# Patient Record
Sex: Female | Born: 1995 | Race: Black or African American | Hispanic: No | Marital: Single | Smoking: Never smoker
Health system: Southern US, Community
[De-identification: ages and names within clinical notes are randomized; demographics above are authoritative.]

## PROBLEM LIST (undated history)

## (undated) DIAGNOSIS — K589 Irritable bowel syndrome without diarrhea: Secondary | ICD-10-CM

## (undated) DIAGNOSIS — F419 Anxiety disorder, unspecified: Secondary | ICD-10-CM

## (undated) DIAGNOSIS — N159 Renal tubulo-interstitial disease, unspecified: Secondary | ICD-10-CM

## (undated) DIAGNOSIS — T7840XA Allergy, unspecified, initial encounter: Secondary | ICD-10-CM

## (undated) DIAGNOSIS — N946 Dysmenorrhea, unspecified: Secondary | ICD-10-CM

## (undated) DIAGNOSIS — Z87442 Personal history of urinary calculi: Secondary | ICD-10-CM

## (undated) DIAGNOSIS — E559 Vitamin D deficiency, unspecified: Secondary | ICD-10-CM

## (undated) DIAGNOSIS — N390 Urinary tract infection, site not specified: Secondary | ICD-10-CM

## (undated) DIAGNOSIS — N920 Excessive and frequent menstruation with regular cycle: Secondary | ICD-10-CM

## (undated) DIAGNOSIS — K219 Gastro-esophageal reflux disease without esophagitis: Secondary | ICD-10-CM

## (undated) DIAGNOSIS — K529 Noninfective gastroenteritis and colitis, unspecified: Secondary | ICD-10-CM

## (undated) DIAGNOSIS — R7303 Prediabetes: Secondary | ICD-10-CM

## (undated) HISTORY — DX: Dysmenorrhea, unspecified: N94.6

## (undated) HISTORY — PX: WRIST SURGERY: SHX841

## (undated) HISTORY — DX: Prediabetes: R73.03

## (undated) HISTORY — DX: Gastro-esophageal reflux disease without esophagitis: K21.9

## (undated) HISTORY — DX: Irritable bowel syndrome, unspecified: K58.9

## (undated) HISTORY — DX: Vitamin D deficiency, unspecified: E55.9

## (undated) HISTORY — DX: Allergy, unspecified, initial encounter: T78.40XA

## (undated) HISTORY — DX: Anxiety disorder, unspecified: F41.9

## (undated) HISTORY — PX: WISDOM TOOTH EXTRACTION: SHX21

## (undated) HISTORY — PX: HERNIA REPAIR: SHX51

## (undated) HISTORY — DX: Excessive and frequent menstruation with regular cycle: N92.0

---
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
## 2015-09-11 ENCOUNTER — Ambulatory Visit (INDEPENDENT_AMBULATORY_CARE_PROVIDER_SITE_OTHER): Payer: BC Managed Care – PPO | Admitting: Physician Assistant

## 2015-09-11 ENCOUNTER — Telehealth: Payer: Self-pay

## 2015-09-11 ENCOUNTER — Encounter: Payer: Self-pay | Admitting: Physician Assistant

## 2015-09-11 VITALS — BP 110/68 | HR 68 | Temp 98.6°F | Resp 18 | Ht 61.0 in | Wt 152.0 lb

## 2015-09-11 DIAGNOSIS — N390 Urinary tract infection, site not specified: Secondary | ICD-10-CM

## 2015-09-11 DIAGNOSIS — R300 Dysuria: Secondary | ICD-10-CM

## 2015-09-11 DIAGNOSIS — R319 Hematuria, unspecified: Secondary | ICD-10-CM

## 2015-09-11 LAB — POCT URINALYSIS DIP (MANUAL ENTRY)
BILIRUBIN UA: NEGATIVE
GLUCOSE UA: NEGATIVE
Ketones, POC UA: NEGATIVE
Nitrite, UA: NEGATIVE
PH UA: 6
Spec Grav, UA: 1.025
UROBILINOGEN UA: 0.2

## 2015-09-11 LAB — POC MICROSCOPIC URINALYSIS (UMFC): MUCUS RE: ABSENT

## 2015-09-11 MED ORDER — CIPROFLOXACIN HCL 500 MG PO TABS: 500.0000 mg | ORAL_TABLET | Freq: Two times a day (BID) | ORAL | 0 refills | Status: DC

## 2015-09-11 NOTE — Patient Instructions (Addendum)
   IF you received an x-ray today, you will receive an invoice from  Radiology. Please contact  Radiology at [phone] with questions or concerns regarding your invoice.   IF you received labwork today, you will receive an invoice from [facility]. Please contact [facility] at [phone] with questions or concerns regarding your invoice.   Our billing staff will not be able to assist you with questions regarding bills from these companies.  You will be contacted with the lab results as soon as they are available. The fastest way to get your results is to activate your My Chart account. Instructions are located on the last page of this paperwork. If you have not heard from us regarding the results in 2 weeks, please contact this office.     Urinary Tract Infection Urinary tract infections (UTIs) can develop anywhere along your urinary tract. Your urinary tract is your body's drainage system for removing wastes and extra water. Your urinary tract includes two kidneys, two ureters, a bladder, and a urethra. Your kidneys are a pair of bean-shaped organs. Each kidney is about the size of your fist. They are located below your ribs, one on each side of your spine. CAUSES Infections are caused by microbes, which are microscopic organisms, including fungi, viruses, and bacteria. These organisms are so small that they can only be seen through a microscope. Bacteria are the microbes that most commonly cause UTIs. SYMPTOMS  Symptoms of UTIs may vary by age and gender of the patient and by the location of the infection. Symptoms in young women typically include a frequent and intense urge to urinate and a painful, burning feeling in the bladder or urethra during urination. Older women and men are more likely to be tired, shaky, and weak and have muscle aches and abdominal pain. A fever may mean the infection is in your kidneys. Other symptoms of a kidney  infection include pain in your back or sides below the ribs, nausea, and vomiting. DIAGNOSIS To diagnose a UTI, your caregiver will ask you about your symptoms. Your caregiver will also ask you to provide a urine sample. The urine sample will be tested for bacteria and white blood cells. White blood cells are made by your body to help fight infection. TREATMENT  Typically, UTIs can be treated with medication. Because most UTIs are caused by a bacterial infection, they usually can be treated with the use of antibiotics. The choice of antibiotic and length of treatment depend on your symptoms and the type of bacteria causing your infection. HOME CARE INSTRUCTIONS  If you were prescribed antibiotics, take them exactly as your caregiver instructs you. Finish the medication even if you feel better after you have only taken some of the medication.  Drink enough water and fluids to keep your urine clear or pale yellow.  Avoid caffeine, tea, and carbonated beverages. They tend to irritate your bladder.  Empty your bladder often. Avoid holding urine for long periods of time.  Empty your bladder before and after sexual intercourse.  After a bowel movement, women should cleanse from front to back. Use each tissue only once. SEEK MEDICAL CARE IF:   You have back pain.  You develop a fever.  Your symptoms do not begin to resolve within 3 days. SEEK IMMEDIATE MEDICAL CARE IF:   You have severe back pain or lower abdominal pain.  You develop chills.  You have nausea or vomiting.  You have continued burning or discomfort with urination.   MAKE SURE YOU:   Understand these instructions.  Will watch your condition.  Will get help right away if you are not doing well or get worse.   This information is not intended to replace advice given to you by your health care provider. Make sure you discuss any questions you have with your health care provider.   Document Released: 10/16/2004 Document  Revised: 09/27/2014 Document Reviewed: 02/14/2011 Elsevier Interactive Patient Education 2016 [employer]  

## 2015-09-11 NOTE — Telephone Encounter (Addendum)
PATIENT'S MOTHER (TONYA) CALLED TO SAY HER DAUGHTER JUST SAW ELIZABETH MCVEY FOR A UTI.  SHE WANTS HER TO TAKE IBUPROFEN FOR HER PAIN. Alizeh TOLD HER SHE HAD SOME AT HOME, BUT THEY REALIZED THEY DON'T. SHE WOULD LIKE TO GET THAT CALLED INTO THE PHARMACY. ALSO, HER DAUGHTER NEEDS A SCHOOL NOTE FOR [employer]. SHE WILL BE OUT ON WED. 09/12/15 AND THURS. 09/13/15 AND RETURNING BACK ON Friday 09/14/15. PLEASE CALL HER WHEN THE NOTE CAN BE PICKED UP. BEST PHONE [phone] (TONYA Stenner-MOM) SHE IS ON [place] H[employer]. PHARMACY CHOICE IS [facility] ON [address].  MBC

## 2015-09-11 NOTE — Progress Notes (Signed)
Amy Lambert  MRN: [id number] DOB: [birth year]  PCP: No PCP Per Patient  Subjective:  Pt is a [age] year old female presenting to clinic for low back pain and blood in urine x 2 days. She has not taken anything for her symptoms.  She first noted lower back pain yesterday, says there is blood in her urine. She is on her period. While she was giving urine sample today she said it burned at the end of her stream.  She had a urinary tract infection 9 months ago, but waited 6 days after her symptoms started before seeking treatment. That infection progressed to her kidneys, sending her to the [facility] for treatment. Was given Levaquin, fluids and vicoden.  She is here today because she is nervous and does not want this to progress to another kidney infection.   - Fever, chills, n/v, pelvic pain, abnormal vaginal discharge  Review of Systems  Constitutional: Negative.   Respiratory: Negative.   Cardiovascular: Negative.   Gastrointestinal: Negative for nausea and vomiting.  Genitourinary: Positive for dysuria, flank pain ("low back pain") and hematuria. Negative for decreased urine volume, difficulty urinating, frequency, pelvic pain, urgency, vaginal discharge and vaginal pain.  Neurological: Negative.     Patient Active Problem List   Diagnosis Date Noted  . Menorrhagia   . Dysmenorrhea     Current Outpatient Prescriptions on File Prior to Visit  Medication Sig Dispense Refill  . HYDROcodone-acetaminophen (NORCO/VICODIN) 5-325 MG tablet Take 2 tablets by mouth every 4 (four) hours as needed. (Patient not taking: Reported on 09/11/2015) 12 tablet 0  . levofloxacin (LEVAQUIN) 250 MG tablet Take 1 tablet (250 mg total) by mouth daily. 7 tablet 0   No current facility-administered medications on file prior to visit.     Allergies  Allergen Reactions  . Penicillins Hives    & fever    Objective:  BP 110/68 (BP Location: Right Arm, Patient Position: Sitting, Cuff Size: Small)   Pulse 68    Temp 98.6 F (37 C) (Oral)   Resp 18   Ht 5\' 1"  (1.549 m)   Wt 152 lb (68.9 kg)   LMP 09/07/2015   SpO2 99%   BMI 28.72 kg/m   Physical Exam  Constitutional: She is oriented to person, place, and time and well-developed, well-nourished, and in no distress. Vital signs are normal. She does not have a sickly appearance. No distress.  Cardiovascular: Normal rate, regular rhythm and normal heart sounds.   Pulmonary/Chest: Effort normal and breath sounds normal. No respiratory distress.  Abdominal: Soft. Bowel sounds are normal. There is tenderness (suprapubic).  Genitourinary:  Genitourinary Comments: No CVA tenderness   Neurological: She is alert and oriented to person, place, and time. GCS score is 15.  Skin: Skin is warm and dry.  Psychiatric: Mood, memory, affect and judgment normal.  Vitals reviewed.  Results for orders placed or performed in visit on 09/11/15  POCT urinalysis dipstick  Result Value Ref Range   Color, UA yellow yellow   Clarity, UA hazy (A) clear   Glucose, UA negative negative   Bilirubin, UA negative negative   Ketones, POC UA negative negative   Spec Grav, UA 1.025    Blood, UA large (A) negative   pH, UA 6.0    Protein Ur, POC trace (A) negative   Urobilinogen, UA 0.2    Nitrite, UA Negative Negative   Leukocytes, UA small (1+) (A) Negative  POCT Microscopic Urinalysis ([facility])  Result Value Ref Range  WBC,UR,HPF,POC Too numerous to count  (A) None WBC/hpf   RBC,UR,HPF,POC Moderate (A) None RBC/hpf   Bacteria Many (A) None, Too numerous to count   Mucus Absent Absent   Epithelial Cells, UR Per Microscopy Few (A) None, Too numerous to count cells/hpf    Assessment and Plan :  1. Blood in urine - POCT urinalysis dipstick - POCT Microscopic Urinalysis ([facility])  2. Urinary tract infection, site not specified - ciprofloxacin (CIPRO) 500 MG tablet; Take 1 tablet (500 mg total) by mouth 2 (two) times daily.  Dispense: 10 tablet; Refill: 0  - Her UA  dipstick does not indicate a UTI at this time, however due to this patient's history of UTI progressing to pyelonephritis, will treat her today as if she does have an infection.  Discussed with patient. She agrees. She understands that she should seek medical care if her symptoms do not improve in 48 hours.   Marco CollieWhitney Shakai Dolley, PA-C  [facility] 09/11/2015 3:32 PM

## 2015-09-12 NOTE — Telephone Encounter (Signed)
Can we send Ibuprofen in? Or just tell them to get it over the counter?

## 2015-09-13 ENCOUNTER — Encounter: Payer: Self-pay | Admitting: Physician Assistant

## 2015-09-13 MED ORDER — IBUPROFEN 800 MG PO TABS: 800.0000 mg | ORAL_TABLET | Freq: Three times a day (TID) | ORAL | 0 refills | Status: DC | PRN

## 2015-09-13 NOTE — Telephone Encounter (Signed)
Called pt and advised message from provider on their voicemail.  

## 2015-10-01 ENCOUNTER — Ambulatory Visit (INDEPENDENT_AMBULATORY_CARE_PROVIDER_SITE_OTHER): Payer: BC Managed Care – PPO | Admitting: Urgent Care

## 2015-10-01 VITALS — BP 128/80 | HR 71 | Temp 98.6°F | Resp 17 | Ht 62.0 in | Wt 152.0 lb

## 2015-10-01 DIAGNOSIS — R300 Dysuria: Secondary | ICD-10-CM

## 2015-10-01 LAB — POCT URINALYSIS DIP (MANUAL ENTRY)
BILIRUBIN UA: NEGATIVE
BILIRUBIN UA: NEGATIVE
GLUCOSE UA: NEGATIVE
Leukocytes, UA: NEGATIVE
Nitrite, UA: NEGATIVE
PROTEIN UA: NEGATIVE
SPEC GRAV UA: 1.02
Urobilinogen, UA: 0.2
pH, UA: 7.5

## 2015-10-01 LAB — CBC
HEMATOCRIT: 36.5 % (ref 35.0–45.0)
Hemoglobin: 12.4 g/dL (ref 11.7–15.5)
MCH: 30.1 pg (ref 27.0–33.0)
MCHC: 34 g/dL (ref 32.0–36.0)
MCV: 88.6 fL (ref 80.0–100.0)
MPV: 10.9 fL (ref 7.5–12.5)
PLATELETS: 302 10*3/uL (ref 140–400)
RBC: 4.12 MIL/uL (ref 3.80–5.10)
RDW: 13.5 % (ref 11.0–15.0)
WBC: 4.5 10*3/uL (ref 3.8–10.8)

## 2015-10-01 LAB — POC MICROSCOPIC URINALYSIS (UMFC): MUCUS RE: ABSENT

## 2015-10-01 LAB — POCT URINE PREGNANCY: PREG TEST UR: NEGATIVE

## 2015-10-01 MED ORDER — SULFAMETHOXAZOLE-TRIMETHOPRIM 800-160 MG PO TABS: 1.0000 | ORAL_TABLET | Freq: Two times a day (BID) | ORAL | 0 refills | Status: DC

## 2015-10-01 MED ORDER — PHENAZOPYRIDINE HCL 95 MG PO TABS: 95.0000 mg | ORAL_TABLET | Freq: Three times a day (TID) | ORAL | 0 refills | Status: DC | PRN

## 2015-10-01 NOTE — Patient Instructions (Addendum)
Urinary Tract Infection A urinary tract infection (UTI) can occur any place along the urinary tract. The tract includes the kidneys, ureters, bladder, and urethra. A type of germ called bacteria often causes a UTI. UTIs are often helped with antibiotic medicine.  HOME CARE   If given, take antibiotics as told by your doctor. Finish them even if you start to feel better.  Drink enough fluids to keep your pee (urine) clear or pale yellow.  Avoid tea, drinks with caffeine, and bubbly (carbonated) drinks.  Pee often. Avoid holding your pee in for a long time.  Pee before and after having sex (intercourse).  Wipe from front to back after you poop (bowel movement) if you are a woman. Use each tissue only once. GET HELP RIGHT AWAY IF:   You have back pain.  You have lower belly (abdominal) pain.  You have chills.  You feel sick to your stomach (nauseous).  You throw up (vomit).  Your burning or discomfort with peeing does not go away.  You have a fever.  Your symptoms are not better in 3 days. MAKE SURE YOU:   Understand these instructions.  Will watch your condition.  Will get help right away if you are not doing well or get worse.   This information is not intended to replace advice given to you by your health care provider. Make sure you discuss any questions you have with your health care provider.   Document Released: 06/25/2007 Document Revised: 01/27/2014 Document Reviewed: 08/07/2011 Elsevier Interactive Patient Education 2016 [employer]     IF you received an x-ray today, you will receive an invoice from [facility]. Please contact [facility] at [phone] with questions or concerns regarding your invoice.   IF you received labwork today, you will receive an invoice from [facility]. Please contact [facility] at [phone] with questions or concerns regarding your invoice.   Our billing staff will not be able to  assist you with questions regarding bills from these companies.  You will be contacted with the lab results as soon as they are available. The fastest way to get your results is to activate your My Chart account. Instructions are located on the last page of this paperwork. If you have not heard from us regarding the results in 2 weeks, please contact this office.

## 2015-10-01 NOTE — Progress Notes (Signed)
MRN: [id number] DOB: Dec 26, 1995  Subjective:   Amy Lambert is a 20 y.o. female presenting for chief complaint of Dysuria (painful urination )  Reports 2 day history of dysuria, hematuria, urinary urgency, pelvic pain and bilateral low back pain, cloudy urine, subjective fever. Patient was last seen 09/11/2015 for similar symptoms. Treated for cystitis with ciprofloxacin. She had a period of improvement up until her symptoms came back. Has not tried any medications for relief. Denies urinary frequency, genital rash, genital irritation and vaginal discharge, nausea and vomiting. Does not hydrate as well as she should. 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 Of note, patient states that she was recently tested for HIV, syphilis within the past 2 months with her OB/GYN. Her LMP was 3 weeks ago, was regular. She is sexually active with 1 female partner for the past year, does not use condoms for protection.  Amy Lambert has a current medication list which includes the following prescription(s): etonogestrel. Patient is allergic to penicillins.  Amy Lambert  has a past medical history of Dysmenorrhea; Menorrhagia; and UTI (lower urinary tract infection). Also  has no past surgical history on file.  Objective:   Vitals: BP 128/80 (BP Location: Right Arm, Patient Position: Sitting, Cuff Size: Normal)   Pulse 71   Temp 98.6 F (37 C) (Oral)   Resp 17   Ht 5\' 2"  (1.575 m)   Wt 152 lb (68.9 kg)   LMP 09/07/2015 (Approximate)   SpO2 97%   BMI 27.80 kg/m   Physical Exam  Constitutional: She is oriented to person, place, and time. She appears well-developed and well-nourished.  Cardiovascular: Normal rate, regular rhythm and intact distal pulses.  Exam reveals no gallop and no friction rub.   No murmur heard. Pulmonary/Chest: No respiratory distress. She has no wheezes. She has no rales.  Abdominal: Soft. Bowel sounds are normal. She exhibits no  distension and no mass. There is no tenderness.  No CVA tenderness.  Neurological: She is alert and oriented to person, place, and time.  Skin: Skin is warm and dry.   Results for orders placed or performed in visit on 10/01/15 (from the past 24 hour(s))  POCT urinalysis dipstick     Status: Abnormal   Collection Time: 10/01/15 10:06 AM  Result Value Ref Range   Color, UA yellow yellow   Clarity, UA clear clear   Glucose, UA negative negative   Bilirubin, UA negative negative   Ketones, POC UA negative negative   Spec Grav, UA 1.020    Blood, UA trace-intact (A) negative   pH, UA 7.5    Protein Ur, POC negative negative   Urobilinogen, UA 0.2    Nitrite, UA Negative Negative   Leukocytes, UA Negative Negative  POCT Microscopic Urinalysis ([facility])     Status: Abnormal   Collection Time: 10/01/15 10:17 AM  Result Value Ref Range   WBC,UR,HPF,POC None None WBC/hpf   RBC,UR,HPF,POC None None RBC/hpf   Bacteria None None, Too numerous to count   Mucus Absent Absent   Epithelial Cells, UR Per Microscopy Few (A) None, Too numerous to count cells/hpf  POCT urine pregnancy     Status: None   Collection Time: 10/01/15 10:18 AM  Result Value Ref Range   Preg Test, Ur Negative Negative   Assessment and Plan :   1. Dysuria - Labs pending. I provided patient with script for Septra in case her symptoms worsen and become  consistent with cystitis while labs are still resulting. She will hydrate aggressively in the meantime and use Pyridium for symptomatic relief. STI testing is pending. RTC in 4 days if no improvement despite normal labs and antibiotic course.  Wallis BambergMario Pete Schnitzer, PA-C [facility] [phone] 10/01/2015 9:57 AM

## 2015-10-02 LAB — GC/CHLAMYDIA PROBE AMP
CT Probe RNA: NOT DETECTED
GC PROBE AMP APTIMA: NOT DETECTED

## 2015-10-03 LAB — URINE CULTURE

## 2015-10-05 ENCOUNTER — Encounter: Payer: Self-pay | Admitting: Urgent Care

## 2015-11-21 HISTORY — PX: WISDOM TOOTH EXTRACTION: SHX21

## 2016-04-01 ENCOUNTER — Ambulatory Visit (INDEPENDENT_AMBULATORY_CARE_PROVIDER_SITE_OTHER): Payer: BC Managed Care – PPO | Admitting: Physician Assistant

## 2016-04-01 VITALS — BP 118/78 | HR 97 | Temp 98.7°F | Resp 18 | Ht 62.0 in | Wt 161.2 lb

## 2016-04-01 DIAGNOSIS — B349 Viral infection, unspecified: Secondary | ICD-10-CM | POA: Diagnosis not present

## 2016-04-01 DIAGNOSIS — R300 Dysuria: Secondary | ICD-10-CM

## 2016-04-01 DIAGNOSIS — R05 Cough: Secondary | ICD-10-CM | POA: Diagnosis not present

## 2016-04-01 DIAGNOSIS — R0981 Nasal congestion: Secondary | ICD-10-CM

## 2016-04-01 DIAGNOSIS — Z87440 Personal history of urinary (tract) infections: Secondary | ICD-10-CM | POA: Diagnosis not present

## 2016-04-01 DIAGNOSIS — R059 Cough, unspecified: Secondary | ICD-10-CM

## 2016-04-01 LAB — POC MICROSCOPIC URINALYSIS (UMFC): Mucus: ABSENT

## 2016-04-01 LAB — POCT CBC
Granulocyte percent: 68.5 %G (ref 37–80)
HCT, POC: 34 % — AB (ref 37.7–47.9)
Hemoglobin: 12 g/dL — AB (ref 12.2–16.2)
Lymph, poc: 2 (ref 0.6–3.4)
MCH, POC: 31.2 pg (ref 27–31.2)
MCHC: 35.2 g/dL (ref 31.8–35.4)
MCV: 88.5 fL (ref 80–97)
MID (cbc): 0.6 (ref 0–0.9)
MPV: 8.8 fL (ref 0–99.8)
POC Granulocyte: 5.5 (ref 2–6.9)
POC LYMPH PERCENT: 24.5 %L (ref 10–50)
POC MID %: 7 % (ref 0–12)
Platelet Count, POC: 279 10*3/uL (ref 142–424)
RBC: 3.84 M/uL — AB (ref 4.04–5.48)
RDW, POC: 13.3 %
WBC: 8.1 10*3/uL (ref 4.6–10.2)

## 2016-04-01 LAB — POCT URINALYSIS DIP (MANUAL ENTRY)
Bilirubin, UA: NEGATIVE
Blood, UA: NEGATIVE
Glucose, UA: NEGATIVE
Ketones, POC UA: NEGATIVE
Leukocytes, UA: NEGATIVE
Nitrite, UA: NEGATIVE
Protein Ur, POC: NEGATIVE
Spec Grav, UA: 1.02
Urobilinogen, UA: 0.2
pH, UA: 7.5

## 2016-04-01 MED ORDER — FLUTICASONE PROPIONATE 50 MCG/ACT NA SUSP: 2.0000 | Freq: Every day | NASAL | 6 refills | Status: DC

## 2016-04-01 MED ORDER — HYDROCODONE-HOMATROPINE 5-1.5 MG/5ML PO SYRP: 5.0000 mL | ORAL_SOLUTION | Freq: Three times a day (TID) | ORAL | 0 refills | Status: DC | PRN

## 2016-04-01 MED ORDER — MUCINEX DM MAXIMUM STRENGTH 60-1200 MG PO TB12: 1.0000 | ORAL_TABLET | Freq: Two times a day (BID) | ORAL | 1 refills | Status: DC

## 2016-04-01 NOTE — Patient Instructions (Signed)
     IF you received an x-ray today, you will receive an invoice from [facility]. Please contact [facility] at [phone] with questions or concerns regarding your invoice.   IF you received labwork today, you will receive an invoice from [facility]. Please contact [facility] at [phone] with questions or concerns regarding your invoice.   Our billing staff will not be able to assist you with questions regarding bills from these companies.  You will be contacted with the lab results as soon as they are available. The fastest way to get your results is to activate your My Chart account. Instructions are located on the last page of this paperwork. If you have not heard from us regarding the results in 2 weeks, please contact this office.     

## 2016-04-01 NOTE — Progress Notes (Signed)
Amy Lambert  MRN: [id number] DOB: [birth year]  PCP: No PCP Per Patient  Subjective:  Pt is a 21 year old female who presents to clinic for nasal congestion and cough x 4 days. [place].  C/o nasal congestion, cough, fatigue. She has been taking NyQuil - helps her sleep. She did not get the flu shot this year. Denies sore throat, chest pain, fever, chills.   She also c/o dysuria x 2 weeks. Low back pain. She drinks about 46 oz of water a day.  H/o UTIs and kidney infections. Denies blood in urine, increased frequency, urgency, abdominal pain, flank pain, fever, chills.   Review of Systems  Constitutional: Negative for chills, fatigue and fever.  HENT: Positive for congestion and postnasal drip. Negative for rhinorrhea, sinus pain, sinus pressure and sneezing.   Respiratory: Negative for cough, shortness of breath and wheezing.   Cardiovascular: Negative for chest pain and palpitations.  Gastrointestinal: Negative for abdominal pain, diarrhea, nausea and vomiting.  Genitourinary: Positive for dysuria. Negative for decreased urine volume, difficulty urinating, enuresis, flank pain, frequency, hematuria and urgency.  Musculoskeletal: Positive for back pain.  Neurological: Positive for headaches. Negative for dizziness, weakness and light-headedness.  Psychiatric/Behavioral: Positive for sleep disturbance.    Patient Active Problem List   Diagnosis Date Noted  . Menorrhagia   . Dysmenorrhea     Current Outpatient Prescriptions on File Prior to Visit  Medication Sig Dispense Refill  . etonogestrel (NEXPLANON) 68 MG IMPL implant 1 each by Subdermal route once.    . phenazopyridine (PYRIDIUM) 95 MG tablet Take 1 tablet (95 mg total) by mouth 3 (three) times daily as needed for pain. (Patient not taking: Reported on 04/01/2016) 15 tablet 0  . sulfamethoxazole-trimethoprim (BACTRIM DS,SEPTRA DS) 800-160 MG tablet Take 1 tablet by mouth 2 (two)  times daily. (Patient not taking: Reported on 04/01/2016) 14 tablet 0   No current facility-administered medications on file prior to visit.     Allergies  Allergen Reactions  . Penicillins Hives    & fever     Objective:  BP 118/78 (BP Location: Right Arm, Patient Position: Sitting, Cuff Size: Small)   Pulse 97   Temp 98.7 F (37.1 C) (Oral)   Resp 18   Ht 5\' 2"  (1.575 m)   Wt 161 lb 3.2 oz (73.1 kg)   SpO2 100%   BMI 29.48 kg/m   Physical Exam  Constitutional: She is oriented to person, place, and time and well-developed, well-nourished, and in no distress. No distress.  HENT:  Right Ear: Tympanic membrane normal.  Left Ear: Tympanic membrane normal.  Nose: Mucosal edema and rhinorrhea present. Right sinus exhibits no maxillary sinus tenderness and no frontal sinus tenderness. Left sinus exhibits no maxillary sinus tenderness and no frontal sinus tenderness.  Mouth/Throat: Mucous membranes are normal. Posterior oropharyngeal edema present. No oropharyngeal exudate or posterior oropharyngeal erythema.  Enlarged tonsils   Cardiovascular: Normal rate, regular rhythm and normal heart sounds.   Abdominal: There is no tenderness. There is no CVA tenderness.  Lymphadenopathy:       Head (right side): No posterior auricular adenopathy present.       Head (left side): No posterior auricular adenopathy present.  Neurological: She is alert and oriented to person, place, and time. GCS score is 15.  Skin: Skin is warm and dry.  Psychiatric: Mood, memory, affect and judgment normal.  Vitals reviewed.  Results for orders placed or performed in  visit on 04/01/16  POCT urinalysis dipstick  Result Value Ref Range   Color, UA yellow yellow   Clarity, UA cloudy (A) clear   Glucose, UA negative negative   Bilirubin, UA negative negative   Ketones, POC UA negative negative   Spec Grav, UA 1.020 1.003, 1.005, 1.010, 1.015, 1.020, 1.025, 1.030, 1.035   Blood, UA negative negative   pH,  UA 7.5 5.0, 5.5, 6.0, 6.5, 7.0, 7.5, 8.0   Protein Ur, POC negative negative   Urobilinogen, UA 0.2 0.2, 1.0   Nitrite, UA Negative Negative   Leukocytes, UA Negative Negative  POCT Microscopic Urinalysis ([facility])  Result Value Ref Range   WBC,UR,HPF,POC None None WBC/hpf   RBC,UR,HPF,POC None None RBC/hpf   Bacteria Many (A) None, Too numerous to count   Mucus Absent Absent   Epithelial Cells, UR Per Microscopy Moderate (A) None, Too numerous to count cells/hpf  POCT CBC  Result Value Ref Range   WBC 8.1 4.6 - 10.2 K/uL   Lymph, poc 2.0 0.6 - 3.4   POC LYMPH PERCENT 24.5 10 - 50 %L   MID (cbc) 0.6 0 - 0.9   POC MID % 7.0 0 - 12 %M   POC Granulocyte 5.5 2 - 6.9   Granulocyte percent 68.5 37 - 80 %G   RBC 3.84 (A) 4.04 - 5.48 M/uL   Hemoglobin 12.0 (A) 12.2 - 16.2 g/dL   HCT, POC 01.0 (A) 27.2 - 47.9 %   MCV 88.5 80 - 97 fL   MCH, POC 31.2 27 - 31.2 pg   MCHC 35.2 31.8 - 35.4 g/dL   RDW, POC 53.6 %   Platelet Count, POC 279 142 - 424 K/uL   MPV 8.8 0 - 99.8 fL    Assessment and Plan :  1. Dysuria 2. History of recurrent UTIs - Urine culture - POCT urinalysis dipstick - POCT Microscopic Urinalysis ([facility]) - POCT CBC - GC/Chlamydia Probe Amp - Ambulatory referral to Urology - This is Amy Lambert' 3rd visit to PCP for dysuria in less than 1 year. Will refer to urology for work-up. Urine culture and STI labs pending. RTC if symptoms worsen.   3. Viral illness. 4. Nasal congestion 5. Cough - fluticasone (FLONASE) 50 MCG/ACT nasal spray; Place 2 sprays into both nostrils daily.  Dispense: 16 g; Refill: 6 - Dextromethorphan-Guaifenesin (MUCINEX DM MAXIMUM STRENGTH) 60-1200 MG TB12; Take 1 tablet by mouth every 12 (twelve) hours.  Dispense: 14 each; Refill: 1 - HYDROcodone-homatropine (HYCODAN) 5-1.5 MG/5ML syrup; Take 5 mLs by mouth every 8 (eight) hours as needed for cough.  Dispense: 120 mL; Refill: 0 - Suspect viral illness. Supportive care encouraged: push fluids, rest. RTC  if symptoms worsen.   Amy Collie, PA-C  [facility] 04/01/2016 3:22 PM

## 2016-04-02 ENCOUNTER — Encounter: Payer: Self-pay | Admitting: Physician Assistant

## 2016-04-02 LAB — GC/CHLAMYDIA PROBE AMP
Chlamydia trachomatis, NAA: NEGATIVE
Neisseria gonorrhoeae by PCR: NEGATIVE

## 2016-04-02 LAB — URINE CULTURE: Organism ID, Bacteria: NO GROWTH

## 2016-04-02 NOTE — Progress Notes (Signed)
Please call pt and let her know she is negative for gonorrhea, chlamydia and urinary tract infection. Thank you!

## 2016-09-25 ENCOUNTER — Encounter: Payer: Self-pay | Admitting: Family Medicine

## 2016-09-25 ENCOUNTER — Ambulatory Visit (INDEPENDENT_AMBULATORY_CARE_PROVIDER_SITE_OTHER): Payer: BC Managed Care – PPO | Admitting: Family Medicine

## 2016-09-25 VITALS — BP 110/76 | HR 78 | Temp 98.4°F | Resp 18 | Ht 61.26 in | Wt 162.8 lb

## 2016-09-25 DIAGNOSIS — R300 Dysuria: Secondary | ICD-10-CM

## 2016-09-25 LAB — POCT URINALYSIS DIP (MANUAL ENTRY)
Bilirubin, UA: NEGATIVE
Glucose, UA: NEGATIVE mg/dL
Ketones, POC UA: NEGATIVE mg/dL
Nitrite, UA: NEGATIVE
Protein Ur, POC: NEGATIVE mg/dL
Spec Grav, UA: 1.02 (ref 1.010–1.025)
Urobilinogen, UA: NEGATIVE E.U./dL — AB
pH, UA: 7 (ref 5.0–8.0)

## 2016-09-25 LAB — POC MICROSCOPIC URINALYSIS (UMFC): Mucus: ABSENT

## 2016-09-25 MED ORDER — CIPROFLOXACIN HCL 500 MG PO TABS: 500.0000 mg | ORAL_TABLET | Freq: Two times a day (BID) | ORAL | 0 refills | Status: DC

## 2016-09-25 NOTE — Patient Instructions (Addendum)
   IF you received an x-ray today, you will receive an invoice from [facility]. Please contact [facility] at [phone] with questions or concerns regarding your invoice.   IF you received labwork today, you will receive an invoice from [facility]. Please contact [facility] at [phone] with questions or concerns regarding your invoice.   Our billing staff will not be able to assist you with questions regarding bills from these companies.  You will be contacted with the lab results as soon as they are available. The fastest way to get your results is to activate your My Chart account. Instructions are located on the last page of this paperwork. If you have not heard from us regarding the results in 2 weeks, please contact this office.     Urinary Tract Infection, Adult A urinary tract infection (UTI) is an infection of any part of the urinary tract, which includes the kidneys, ureters, bladder, and urethra. These organs make, store, and get rid of urine in the body. UTI can be a bladder infection (cystitis) or kidney infection (pyelonephritis). What are the causes? This infection may be caused by fungi, viruses, or bacteria. Bacteria are the most common cause of UTIs. This condition can also be caused by repeated incomplete emptying of the bladder during urination. What increases the risk? This condition is more likely to develop if:  You ignore your need to urinate or hold urine for long periods of time.  You do not empty your bladder completely during urination.  You wipe back to front after urinating or having a bowel movement, if you are female.  You are uncircumcised, if you are female.  You are constipated.  You have a urinary catheter that stays in place (indwelling).  You have a weak defense (immune) system.  You have a medical condition that affects your bowels, kidneys, or bladder.  You have diabetes.  You take antibiotic medicines frequently or  for long periods of time, and the antibiotics no longer work well against certain types of infections (antibiotic resistance).  You take medicines that irritate your urinary tract.  You are exposed to chemicals that irritate your urinary tract.  You are female. What are the signs or symptoms? Symptoms of this condition include:  Fever.  Frequent urination or passing small amounts of urine frequently.  Needing to urinate urgently.  Pain or burning with urination.  Urine that smells bad or unusual.  Cloudy urine.  Pain in the lower abdomen or back.  Trouble urinating.  Blood in the urine.  Vomiting or being less hungry than normal.  Diarrhea or abdominal pain.  Vaginal discharge, if you are female. How is this diagnosed? This condition is diagnosed with a medical history and physical exam. You will also need to provide a urine sample to test your urine. Other tests may be done, including:  Blood tests.  Sexually transmitted disease (STD) testing. If you have had more than one UTI, a cystoscopy or imaging studies may be done to determine the cause of the infections. How is this treated? Treatment for this condition often includes a combination of two or more of the following:  Antibiotic medicine.  Other medicines to treat less common causes of UTI.  Over-the-counter medicines to treat pain.  Drinking enough water to stay hydrated. Follow these instructions at home:  Take over-the-counter and prescription medicines only as told by your health care provider.  If you were prescribed an antibiotic, take it as told by your health care   provider. Do not stop taking the antibiotic even if you start to feel better.  Avoid alcohol, caffeine, tea, and carbonated beverages. They can irritate your bladder.  Drink enough fluid to keep your urine clear or pale yellow.  Keep all follow-up visits as told by your health care provider. This is important.  Make sure  to:  Empty your bladder often and completely. Do not hold urine for long periods of time.  Empty your bladder before and after sex.  Wipe from front to back after a bowel movement if you are female. Use each tissue one time when you wipe. Contact a health care provider if:  You have back pain.  You have a fever.  You feel nauseous or vomit.  Your symptoms do not get better after 3 days.  Your symptoms go away and then return. Get help right away if:  You have severe back pain or lower abdominal pain.  You are vomiting and cannot keep down any medicines or water. This information is not intended to replace advice given to you by your health care provider. Make sure you discuss any questions you have with your health care provider. Document Released: 10/16/2004 Document Revised: 06/20/2015 Document Reviewed: 11/27/2014 Elsevier Interactive Patient Education  2017 [employer] Inc.  

## 2016-09-25 NOTE — Progress Notes (Signed)
9/6/201812:25 PM  Valaria GoodNaryah Stahly 02-Sep-1995, 21 y.o. female [id number]  Chief Complaint  Patient presents with  . Dysuria    X 1 week  . Hematuria    X 2 days- with fever    HPI:   Patient is a 21 y.o. female who presents today for 1 week of worsening of blood in urine, pain/pressure with urination, frequency, fever yesterday, mild nausea and flank pain. Has been trying to increase fluids, drink cranberry juice, taking OTC azo without improvement in sx. Has h/o recurring UTI and pyelo, last UTI about a year and half ago. Denies vaginal discharge, irritation, itchiness.  Depression screen [employer]HQ 2/9 09/25/2016 04/01/2016 10/01/2015  Decreased Interest 0 0 0  Down, Depressed, Hopeless 0 0 0  PHQ - 2 Score 0 0 0    Allergies  Allergen Reactions  . Penicillins Hives    & fever    Current Outpatient Prescriptions on File Prior to Visit  Medication Sig Dispense Refill  . Dextromethorphan-Guaifenesin (MUCINEX DM MAXIMUM STRENGTH) 60-1200 MG TB12 Take 1 tablet by mouth every 12 (twelve) hours. 14 each 1  . etonogestrel (NEXPLANON) 68 MG IMPL implant 1 each by Subdermal route once.    . fluticasone (FLONASE) 50 MCG/ACT nasal spray Place 2 sprays into both nostrils daily. 16 g 6  . HYDROcodone-homatropine (HYCODAN) 5-1.5 MG/5ML syrup Take 5 mLs by mouth every 8 (eight) hours as needed for cough. (Patient not taking: Reported on 09/25/2016) 120 mL 0   No current facility-administered medications on file prior to visit.     Past Medical History:  Diagnosis Date  . Dysmenorrhea   . Menorrhagia   . UTI (lower urinary tract infection)     History reviewed. No pertinent surgical history.  Social History  Substance Use Topics  . Smoking status: Never Smoker  . Smokeless tobacco: Never Used  . Alcohol use Yes     Comment: occ    Family History  Problem Relation Age of Onset  . Hypertension Paternal Grandmother   . Diabetes Maternal Grandmother   . Hypertension Maternal Grandmother      Review of Systems  Constitutional: Positive for fever and malaise/fatigue. Negative for chills.  Gastrointestinal: Positive for abdominal pain and nausea. Negative for vomiting.  Genitourinary: Positive for dysuria, flank pain, frequency and hematuria.     OBJECTIVE:  Blood pressure 110/76, pulse 78, temperature 98.4 F (36.9 C), temperature source Oral, resp. rate 18, height 5' 1.26" (1.556 m), weight 162 lb 12.8 oz (73.8 kg), last menstrual period 09/24/2016, SpO2 100 %.  Physical Exam  Constitutional: She is oriented to person, place, and time and well-developed, well-nourished, and in no distress.  HENT:  Head: Normocephalic and atraumatic.  Mouth/Throat: Oropharynx is clear and moist. No oropharyngeal exudate.  Eyes: Pupils are equal, round, and reactive to light. EOM are normal. No scleral icterus.  Neck: Neck supple.  Cardiovascular: Normal rate, regular rhythm and normal heart sounds.  Exam reveals no gallop and no friction rub.   No murmur heard. Pulmonary/Chest: Effort normal and breath sounds normal. She has no wheezes. She has no rales.  Abdominal: Soft. Bowel sounds are normal. She exhibits no distension and no mass. There is tenderness. There is CVA tenderness. There is no rebound.  Musculoskeletal: She exhibits no edema.  Neurological: She is alert and oriented to person, place, and time. Gait normal.  Skin: Skin is warm and dry.    Results for orders placed or performed in visit on 09/25/16 (from  the past 24 hour(s))  POCT urinalysis dipstick     Status: Abnormal   Collection Time: 09/25/16 12:20 PM  Result Value Ref Range   Color, UA yellow yellow   Clarity, UA clear clear   Glucose, UA negative negative mg/dL   Bilirubin, UA negative negative   Ketones, POC UA negative negative mg/dL   Spec Grav, UA 1.610 9.604 - 1.025   Blood, UA moderate (A) negative   pH, UA 7.0 5.0 - 8.0   Protein Ur, POC negative negative mg/dL   Urobilinogen, UA negative (A) 0.2  or 1.0 E.U./dL   Nitrite, UA Negative Negative   Leukocytes, UA  Negative    ASSESSMENT and PLAN:  1. Dysuria UA neg L/N but given sx and past history treating empirically for mild pyelo with cipro. Discussed with patient alternative diagnosis and symptoms of concerns for which to return to care.  - POCT urinalysis dipstick - POCT Microscopic Urinalysis ([facility]) - Urine Culture      Myles Lipps, MD Primary Care at [facility] [address] [place], [place] [address] Ph.  [phone] Fax [phone]

## 2016-09-26 LAB — URINE CULTURE

## 2017-02-05 ENCOUNTER — Encounter (HOSPITAL_BASED_OUTPATIENT_CLINIC_OR_DEPARTMENT_OTHER): Payer: Self-pay

## 2017-02-05 ENCOUNTER — Emergency Department (HOSPITAL_BASED_OUTPATIENT_CLINIC_OR_DEPARTMENT_OTHER): Payer: BC Managed Care – PPO

## 2017-02-05 ENCOUNTER — Other Ambulatory Visit: Payer: Self-pay

## 2017-02-05 ENCOUNTER — Emergency Department (HOSPITAL_BASED_OUTPATIENT_CLINIC_OR_DEPARTMENT_OTHER)
Admission: EM | Admit: 2017-02-05 | Discharge: 2017-02-06 | Disposition: A | Discharge status: Home / Self Care | Payer: BC Managed Care – PPO | Attending: Emergency Medicine | Admitting: Emergency Medicine

## 2017-02-05 DIAGNOSIS — R109 Unspecified abdominal pain: Secondary | ICD-10-CM | POA: Diagnosis present

## 2017-02-05 DIAGNOSIS — N200 Calculus of kidney: Secondary | ICD-10-CM | POA: Diagnosis not present

## 2017-02-05 DIAGNOSIS — Z79899 Other long term (current) drug therapy: Secondary | ICD-10-CM | POA: Insufficient documentation

## 2017-02-05 LAB — CBC WITH DIFFERENTIAL/PLATELET
BASOS ABS: 0 10*3/uL (ref 0.0–0.1)
Basophils Relative: 0 %
EOS PCT: 3 %
Eosinophils Absolute: 0.3 10*3/uL (ref 0.0–0.7)
HCT: 32.9 % — ABNORMAL LOW (ref 36.0–46.0)
Hemoglobin: 10.6 g/dL — ABNORMAL LOW (ref 12.0–15.0)
LYMPHS PCT: 35 %
Lymphs Abs: 4.1 10*3/uL — ABNORMAL HIGH (ref 0.7–4.0)
MCH: 29 pg (ref 26.0–34.0)
MCHC: 32.2 g/dL (ref 30.0–36.0)
MCV: 90.1 fL (ref 78.0–100.0)
Monocytes Absolute: 1.1 10*3/uL — ABNORMAL HIGH (ref 0.1–1.0)
Monocytes Relative: 9 %
Neutro Abs: 6.4 10*3/uL (ref 1.7–7.7)
Neutrophils Relative %: 53 %
PLATELETS: 295 10*3/uL (ref 150–400)
RBC: 3.65 MIL/uL — AB (ref 3.87–5.11)
RDW: 13.8 % (ref 11.5–15.5)
WBC: 12 10*3/uL — AB (ref 4.0–10.5)

## 2017-02-05 LAB — COMPREHENSIVE METABOLIC PANEL
ALT: 16 U/L (ref 14–54)
ANION GAP: 6 (ref 5–15)
AST: 18 U/L (ref 15–41)
Albumin: 3.7 g/dL (ref 3.5–5.0)
Alkaline Phosphatase: 47 U/L (ref 38–126)
BILIRUBIN TOTAL: 0.2 mg/dL — AB (ref 0.3–1.2)
BUN: 9 mg/dL (ref 6–20)
CO2: 25 mmol/L (ref 22–32)
Calcium: 8.6 mg/dL — ABNORMAL LOW (ref 8.9–10.3)
Chloride: 106 mmol/L (ref 101–111)
Creatinine, Ser: 0.82 mg/dL (ref 0.44–1.00)
GFR calc Af Amer: >60 mL/min (ref >60)
GFR calc non Af Amer: >60 mL/min (ref >60)
Glucose, Bld: 106 mg/dL — ABNORMAL HIGH (ref 65–99)
POTASSIUM: 3.4 mmol/L — AB (ref 3.5–5.1)
Sodium: 137 mmol/L (ref 135–145)
TOTAL PROTEIN: 7.8 g/dL (ref 6.5–8.1)

## 2017-02-05 LAB — URINALYSIS, MICROSCOPIC (REFLEX)

## 2017-02-05 LAB — URINALYSIS, ROUTINE W REFLEX MICROSCOPIC
Bilirubin Urine: NEGATIVE
Glucose, UA: NEGATIVE mg/dL
KETONES UR: NEGATIVE mg/dL
Leukocytes, UA: NEGATIVE
Nitrite: NEGATIVE
PH: 6 (ref 5.0–8.0)
Protein, ur: NEGATIVE mg/dL
Specific Gravity, Urine: >1.03 — ABNORMAL HIGH (ref 1.005–1.030)

## 2017-02-05 LAB — PREGNANCY, URINE: Preg Test, Ur: NEGATIVE

## 2017-02-05 MED ORDER — TAMSULOSIN HCL 0.4 MG PO CAPS
0.4000 mg | ORAL_CAPSULE | Freq: Every day | ORAL | Status: DC
  Administered 2017-02-05: 0.4 mg via ORAL

## 2017-02-05 MED ORDER — KETOROLAC TROMETHAMINE 30 MG/ML IJ SOLN
15.0000 mg | Freq: Once | INTRAMUSCULAR | Status: AC
  Administered 2017-02-05: 15 mg via INTRAVENOUS
  Filled 2017-02-05: qty 1

## 2017-02-05 MED ORDER — TAMSULOSIN HCL 0.4 MG PO CAPS
0.4000 mg | ORAL_CAPSULE | Freq: Every day | ORAL | Status: DC
  Filled 2017-02-05: qty 1

## 2017-02-05 MED ORDER — ONDANSETRON HCL 4 MG/2ML IJ SOLN
4.0000 mg | Freq: Once | INTRAMUSCULAR | Status: AC
  Administered 2017-02-05: 4 mg via INTRAVENOUS
  Filled 2017-02-05: qty 2

## 2017-02-05 MED ORDER — FENTANYL CITRATE (PF) 100 MCG/2ML IJ SOLN
50.0000 ug | INTRAMUSCULAR | Status: DC | PRN
  Administered 2017-02-05: 50 ug via INTRAVENOUS
  Filled 2017-02-05: qty 2

## 2017-02-05 NOTE — ED Notes (Signed)
Pt requesting more pain medication. Pt informed she would have to wait until she was seen by the doctor to get more pain medication. Explained to pt that standing orders for pain medication have already been given and we could given any more pain medicine at this time.

## 2017-02-05 NOTE — ED Notes (Signed)
Initial urine specimen was not enough to get urine culture from.

## 2017-02-05 NOTE — ED Triage Notes (Signed)
C/o left flank/pelvic pain x 1 hour-grimacing-bent over gait

## 2017-02-06 ENCOUNTER — Encounter (HOSPITAL_BASED_OUTPATIENT_CLINIC_OR_DEPARTMENT_OTHER): Payer: Self-pay | Admitting: Emergency Medicine

## 2017-02-06 MED ORDER — OXYCODONE-ACETAMINOPHEN 5-325 MG PO TABS: 1.0000 | ORAL_TABLET | Freq: Four times a day (QID) | ORAL | 0 refills | Status: DC | PRN

## 2017-02-06 MED ORDER — TAMSULOSIN HCL 0.4 MG PO CAPS: 0.4000 mg | ORAL_CAPSULE | Freq: Every day | ORAL | 0 refills | Status: DC

## 2017-02-06 MED ORDER — ONDANSETRON 8 MG PO TBDP: ORAL_TABLET | ORAL | 0 refills | Status: DC

## 2017-02-06 MED ORDER — DICLOFENAC SODIUM ER 100 MG PO TB24: 100.0000 mg | ORAL_TABLET | Freq: Every day | ORAL | 0 refills | Status: DC

## 2017-02-06 NOTE — ED Notes (Signed)
Pt verbalizes understanding of d/c instructions and denies any further needs at this time. 

## 2017-02-06 NOTE — ED Provider Notes (Signed)
[facility] EMERGENCY DEPARTMENT Provider Note   CSN: [id number] Arrival date & time: 02/05/17  2122     History   Chief Complaint Chief Complaint  Patient presents with  . Flank Pain    HPI Amy Lambert is a 22 y.o. female.  The history is provided by the patient.  Flank Pain  This is a new problem. The current episode started 3 to 5 hours ago. The problem occurs constantly. The problem has not changed since onset.Pertinent negatives include no chest pain, no headaches and no shortness of breath. Nothing aggravates the symptoms. She has tried nothing for the symptoms. The treatment provided no relief.  Right flank pain with nausea.  No f/c/r.    Past Medical History:  Diagnosis Date  . Dysmenorrhea   . Menorrhagia   . UTI (lower urinary tract infection)     Patient Active Problem List   Diagnosis Date Noted  . Menorrhagia   . Dysmenorrhea     History reviewed. No pertinent surgical history.  OB History    Gravida Para Term Preterm AB Living   0 0 0 0 0 0   SAB TAB Ectopic Multiple Live Births   0 0 0 0         Home Medications    Prior to Admission medications   Medication Sig Start Date End Date Taking? Authorizing Provider  Diclofenac Sodium CR (VOLTAREN-XR) 100 MG 24 hr tablet Take 1 tablet (100 mg total) by mouth daily. 02/06/17   Dray Dente, MD  etonogestrel (NEXPLANON) 68 MG IMPL implant 1 each by Subdermal route once.    [provider]  ondansetron (ZOFRAN ODT) 8 MG disintegrating tablet 8mg  ODT q8 hours prn nausea 02/06/17   Verta Riedlinger, MD  oxyCODONE-acetaminophen (PERCOCET) 5-325 MG tablet Take 1 tablet by mouth every 6 (six) hours as needed. 02/06/17   Kataryna Mcquilkin, MD  tamsulosin (FLOMAX) 0.4 MG CAPS capsule Take 1 capsule (0.4 mg total) by mouth daily. 02/06/17   Shatasia Cutshaw, MD    Family History Family History  Problem Relation Age of Onset  . Hypertension Paternal Grandmother   . Diabetes Maternal Grandmother    . Hypertension Maternal Grandmother     Social History Social History   Tobacco Use  . Smoking status: Never Smoker  . Smokeless tobacco: Never Used  Substance Use Topics  . Alcohol use: Yes    Comment: occ  . Drug use: No     Allergies   Penicillins   Review of Systems Review of Systems  Constitutional: Negative for fever.  Respiratory: Negative for shortness of breath.   Cardiovascular: Negative for chest pain.  Gastrointestinal: Positive for nausea.  Genitourinary: Positive for flank pain. Negative for dysuria, genital sores and hematuria.  Neurological: Negative for headaches.  All other systems reviewed and are negative.    Physical Exam Updated Vital Signs BP (!) 109/58 (BP Location: Right Arm)   Pulse 97   Temp 98.7 F (37.1 C) (Oral)   Resp 18   Ht 5' (1.524 m)   Wt 79.9 kg (176 lb 2.4 oz)   SpO2 99%   BMI 34.40 kg/m   Physical Exam  Constitutional: She is oriented to person, place, and time. She appears well-developed and well-nourished. No distress.  HENT:  Head: Normocephalic and atraumatic.  Mouth/Throat: No oropharyngeal exudate.  Eyes: Conjunctivae are normal. Pupils are equal, round, and reactive to light.  Neck: Normal range of motion. Neck supple.  Cardiovascular: Normal rate,  regular rhythm, normal heart sounds and intact distal pulses.  Pulmonary/Chest: Effort normal and breath sounds normal. No stridor. She has no wheezes. She has no rales.  Abdominal: Soft. Bowel sounds are normal. She exhibits no mass. There is no tenderness. There is no rebound and no guarding.  Musculoskeletal: Normal range of motion.  Neurological: She is alert and oriented to person, place, and time. She displays normal reflexes.  Skin: Skin is warm and dry. Capillary refill takes less than 2 seconds.  Psychiatric: She has a normal mood and affect.     ED Treatments / Results  Labs (all labs ordered are listed, but only abnormal results are displayed) Labs  Reviewed  URINALYSIS, ROUTINE W REFLEX MICROSCOPIC - Abnormal; Notable for the following components:      Result Value   APPearance CLOUDY (*)    Specific Gravity, Urine >1.030 (*)    Hgb urine dipstick LARGE (*)    All other components within normal limits  COMPREHENSIVE METABOLIC PANEL - Abnormal; Notable for the following components:   Potassium 3.4 (*)    Glucose, Bld 106 (*)    Calcium 8.6 (*)    Total Bilirubin 0.2 (*)    All other components within normal limits  CBC WITH DIFFERENTIAL/PLATELET - Abnormal; Notable for the following components:   WBC 12.0 (*)    RBC 3.65 (*)    Hemoglobin 10.6 (*)    HCT 32.9 (*)    Lymphs Abs 4.1 (*)    Monocytes Absolute 1.1 (*)    All other components within normal limits  URINALYSIS, MICROSCOPIC (REFLEX) - Abnormal; Notable for the following components:   Bacteria, UA FEW (*)    Squamous Epithelial / LPF 0-5 (*)    All other components within normal limits  URINE CULTURE  PREGNANCY, URINE    EKG  EKG Interpretation None       Radiology Ct Renal Stone Study  Result Date: 02/05/2017 CLINICAL DATA:  Left flank pain for 3 hours EXAM: CT ABDOMEN AND PELVIS WITHOUT CONTRAST TECHNIQUE: Multidetector CT imaging of the abdomen and pelvis was performed following the standard protocol without IV contrast. COMPARISON:  CT 11/26/2014 FINDINGS: Lower chest: No acute abnormality. Hepatobiliary: No focal liver abnormality is seen. No gallstones, gallbladder wall thickening, or biliary dilatation. Pancreas: Unremarkable. No pancreatic ductal dilatation or surrounding inflammatory changes. Spleen: Normal in size without focal abnormality. Adrenals/Urinary Tract: Adrenal glands are within normal limits. Increased density at the right renal pyramids. Multiple punctate intrarenal stones bilaterally. Minimal left hydronephrosis and prominence of left ureter, secondary to a 3 mm stone in the distal left ureter, just above the left UVJ. The bladder is normal.  Stomach/Bowel: Stomach is within normal limits. Appendix appears normal. No evidence of bowel wall thickening, distention, or inflammatory changes. Vascular/Lymphatic: No significant vascular findings are present. No enlarged abdominal or pelvic lymph nodes. Reproductive: Uterus unremarkable.  3.6 cm cyst in the left ovary. Other: No abdominal wall hernia or abnormality. No abdominopelvic ascites. Musculoskeletal: No acute or significant osseous findings. IMPRESSION: 1. Minimal left hydronephrosis and hydroureter secondary to a 3 mm stone in the distal left ureter, just above the left UVJ 2. 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   On: 02/05/2017 23:10    Procedures Procedures (including critical care time)  Medications Ordered in ED Medications  ondansetron (ZOFRAN) injection 4 mg (4 mg Intravenous Given 02/05/17 2212)  ketorolac (TORADOL) 30 MG/ML injection 15 mg (15 mg Intravenous Given 02/05/17 2332)  ondansetron (ZOFRAN) injection 4 mg (4 mg Intravenous Given 02/05/17 2332)     Final Clinical Impressions(s) / ED Diagnoses   Final diagnoses:  Nephrolithiasis   Strain all urine, follow up with urology for ongoing care.  Take all medications as directed.  No drinking alcohol or driving a car while taking opioids.    Return for worsening pain, vomiting blood inability to pass urine,  fevers > 100.4 unrelieved by medication, shortness of breath, intractable vomiting, or diarrhea, abdominal pain, Inability to tolerate liquids or food, cough, altered mental status or any concerns. No signs of systemic illness or infection. The patient is nontoxic-appearing on exam and vital signs are within normal limits.    I have reviewed the triage vital signs and the nursing notes. Pertinent labs &imaging results that were available during my care of the patient were reviewed by me and  considered in my medical decision making (see chart for details).  After history, exam, and medical workup I feel the patient has been appropriately medically screened and is safe for discharge home. Pertinent diagnoses were discussed with the patient. Patient was given return precautions.    ED Discharge Orders        Ordered    Diclofenac Sodium CR (VOLTAREN-XR) 100 MG 24 hr tablet  Daily     02/06/17 0024    oxyCODONE-acetaminophen (PERCOCET) 5-325 MG tablet  Every 6 hours PRN     02/06/17 0024    ondansetron (ZOFRAN ODT) 8 MG disintegrating tablet     02/06/17 0024    tamsulosin (FLOMAX) 0.4 MG CAPS capsule  Daily     02/06/17 0024       Maisee Vollman, MD 02/06/17 6045

## 2017-02-07 LAB — URINE CULTURE

## 2017-10-27 ENCOUNTER — Emergency Department (HOSPITAL_BASED_OUTPATIENT_CLINIC_OR_DEPARTMENT_OTHER)
Admission: EM | Admit: 2017-10-27 | Discharge: 2017-10-27 | Disposition: A | Discharge status: Home / Self Care | Payer: BC Managed Care – PPO | Attending: Emergency Medicine | Admitting: Emergency Medicine

## 2017-10-27 ENCOUNTER — Encounter: Payer: Self-pay | Admitting: Nurse Practitioner

## 2017-10-27 ENCOUNTER — Encounter (HOSPITAL_BASED_OUTPATIENT_CLINIC_OR_DEPARTMENT_OTHER): Payer: Self-pay | Admitting: *Deleted

## 2017-10-27 ENCOUNTER — Other Ambulatory Visit: Payer: Self-pay

## 2017-10-27 ENCOUNTER — Ambulatory Visit: Payer: BC Managed Care – PPO | Admitting: Internal Medicine

## 2017-10-27 ENCOUNTER — Ambulatory Visit: Payer: BC Managed Care – PPO | Admitting: Nurse Practitioner

## 2017-10-27 VITALS — BP 138/100 | HR 138 | Temp 102.9°F | Ht 60.0 in | Wt 177.0 lb

## 2017-10-27 DIAGNOSIS — R509 Fever, unspecified: Secondary | ICD-10-CM | POA: Diagnosis not present

## 2017-10-27 DIAGNOSIS — J039 Acute tonsillitis, unspecified: Secondary | ICD-10-CM | POA: Diagnosis not present

## 2017-10-27 DIAGNOSIS — J029 Acute pharyngitis, unspecified: Secondary | ICD-10-CM

## 2017-10-27 DIAGNOSIS — E860 Dehydration: Secondary | ICD-10-CM | POA: Diagnosis not present

## 2017-10-27 LAB — CBC WITH DIFFERENTIAL/PLATELET
Abs Immature Granulocytes: 0.11 10*3/uL — ABNORMAL HIGH (ref 0.00–0.07)
BASOS ABS: 0 10*3/uL (ref 0.0–0.1)
BASOS PCT: 1 %
EOS PCT: 0 %
Eosinophils Absolute: 0 10*3/uL (ref 0.0–0.5)
HCT: 36.7 % (ref 36.0–46.0)
Hemoglobin: 11.9 g/dL — ABNORMAL LOW (ref 12.0–15.0)
Immature Granulocytes: 1 %
Lymphocytes Relative: 11 %
Lymphs Abs: 1.3 10*3/uL (ref 0.7–4.0)
MCH: 28 pg (ref 26.0–34.0)
MCHC: 32.4 g/dL (ref 30.0–36.0)
MCV: 86.4 fL (ref 80.0–100.0)
Monocytes Absolute: 0.7 10*3/uL (ref 0.1–1.0)
Monocytes Relative: 6 %
Neutro Abs: 9.4 10*3/uL — ABNORMAL HIGH (ref 1.7–7.7)
Neutrophils Relative %: 82 %
Platelets: 310 10*3/uL (ref 150–400)
RBC: 4.25 MIL/uL (ref 3.87–5.11)
RDW: 14.1 % (ref 11.5–15.5)
WBC: 11.5 10*3/uL — AB (ref 4.0–10.5)
nRBC: 0 % (ref 0.0–0.2)

## 2017-10-27 LAB — COMPREHENSIVE METABOLIC PANEL
ALK PHOS: 60 U/L (ref 38–126)
ALT: 13 U/L (ref 0–44)
AST: 13 U/L — ABNORMAL LOW (ref 15–41)
Albumin: 3.7 g/dL (ref 3.5–5.0)
Anion gap: 8 (ref 5–15)
BILIRUBIN TOTAL: 0.3 mg/dL (ref 0.3–1.2)
BUN: 11 mg/dL (ref 6–20)
CALCIUM: 8.8 mg/dL — AB (ref 8.9–10.3)
CO2: 24 mmol/L (ref 22–32)
Chloride: 100 mmol/L (ref 98–111)
Creatinine, Ser: 0.85 mg/dL (ref 0.44–1.00)
GFR calc Af Amer: >60 mL/min (ref >60)
GFR calc non Af Amer: >60 mL/min (ref >60)
Glucose, Bld: 91 mg/dL (ref 70–99)
Potassium: 3.6 mmol/L (ref 3.5–5.1)
SODIUM: 132 mmol/L — AB (ref 135–145)
TOTAL PROTEIN: 8.2 g/dL — AB (ref 6.5–8.1)

## 2017-10-27 LAB — GROUP A STREP BY PCR: Group A Strep by PCR: NOT DETECTED

## 2017-10-27 LAB — POC INFLUENZA A&B (BINAX/QUICKVUE)
INFLUENZA A, POC: NEGATIVE
INFLUENZA B, POC: NEGATIVE

## 2017-10-27 LAB — I-STAT CG4 LACTIC ACID, ED: LACTIC ACID, VENOUS: 0.81 mmol/L (ref 0.5–1.9)

## 2017-10-27 MED ORDER — KETOROLAC TROMETHAMINE 30 MG/ML IJ SOLN
30.0000 mg | Freq: Once | INTRAMUSCULAR | Status: AC
  Administered 2017-10-27: 30 mg via INTRAMUSCULAR

## 2017-10-27 MED ORDER — SODIUM CHLORIDE 0.9 % IV BOLUS
1000.0000 mL | Freq: Once | INTRAVENOUS | Status: AC
  Administered 2017-10-27: 1000 mL via INTRAVENOUS

## 2017-10-27 MED ORDER — DEXAMETHASONE SODIUM PHOSPHATE 10 MG/ML IJ SOLN
10.0000 mg | Freq: Once | INTRAMUSCULAR | Status: AC
Start: 2017-10-27 — End: 2017-10-27
  Administered 2017-10-27: 10 mg via INTRAVENOUS
  Filled 2017-10-27: qty 1

## 2017-10-27 MED ORDER — IBUPROFEN 400 MG PO TABS
400.0000 mg | ORAL_TABLET | Freq: Once | ORAL | Status: AC
  Administered 2017-10-27: 400 mg via ORAL
  Filled 2017-10-27: qty 1

## 2017-10-27 NOTE — Patient Instructions (Addendum)
Please go to [facility] Med center for IV fluids and monitor due to high fever and dehydration.  [address], [place], [place] [address].   Fever, Adult A fever is an increase in the body's temperature. It is often defined as a temperature of 100 F (38C) or higher. Short mild or moderate fevers often have no long-term effects. They also often do not need treatment. Moderate or high fevers may make you feel uncomfortable. Sometimes, they can also be a sign of a serious illness or disease. The sweating that may happen with repeated fevers or fevers that last a while may also cause you to not have enough fluid in your body (dehydration). You can take your temperature with a thermometer to see if you have a fever. A measured temperature can change with:  Age.  Time of day.  Where the thermometer is placed: ? Mouth (oral). ? Rectum (rectal). ? Ear (tympanic). ? Underarm (axillary). ? Forehead (temporal).  Follow these instructions at home: Pay attention to any changes in your symptoms. Take these actions to help with your condition:  Take over-the-counter and prescription medicines only as told by your doctor. Follow the dosing instructions carefully.  If you were prescribed an antibiotic medicine, take it as told by your doctor. Do not stop taking the antibiotic even if you start to feel better.  Rest as needed.  Drink enough fluid to keep your pee (urine) clear or pale yellow.  Sponge yourself or bathe with room-temperature water as needed. This helps to lower your body temperature . Do not use ice water.  Do not wear too many blankets or heavy clothes.  Contact a doctor if:  You throw up (vomit).  You cannot eat or drink without throwing up.  You have watery poop (diarrhea).  It hurts when you pee.  Your symptoms do not get better with treatment.  You have new symptoms.  You feel very weak. Get help right away if:  You are short of breath or have trouble  breathing.  You are dizzy or you pass out (faint).  You feel confused.  You have signs of not having enough fluid in your body, such as: ? A dry mouth. ? Peeing less. ? Looking pale.  You have very bad pain in your belly (abdomen).  You keep throwing up or having water poop.  You have a skin rash.  Your symptoms suddenly get worse. This information is not intended to replace advice given to you by your health care provider. Make sure you discuss any questions you have with your health care provider. Document Released: 10/16/2007 Document Revised: 06/14/2015 Document Reviewed: 03/02/2014 Elsevier Interactive Patient Education  [employer].

## 2017-10-27 NOTE — Discharge Instructions (Signed)
Please read and follow all provided instructions.  Your diagnoses today include:  1. Tonsillitis    Tests performed today include:  Blood counts and electrolytes  Lactic acid - no severe sepsis  Blood cultures - pending  Strep test - negative  Vital signs. See below for your results today.   Medications prescribed:   None  Take any prescribed medications only as directed.  Home care instructions:  Follow any educational materials contained in this packet.  Follow-up instructions: Please follow-up with your primary care provider in the next 5 days for further evaluation of your symptoms.   Return instructions:   Please return to the Emergency Department if you experience worsening symptoms.   Please return if you have any other emergent concerns.  Additional Information:  Your vital signs today were: BP 110/75 (BP Location: Left Arm)    Pulse (!) 102    Temp 99.2 F (37.3 C) (Oral)    Resp 20    Ht 5' (1.524 m)    Wt 82 kg    SpO2 99%    BMI 35.31 kg/m  If your blood pressure (BP) was elevated above 135/85 this visit, please have this repeated by your doctor within one month. --------------

## 2017-10-27 NOTE — ED Notes (Signed)
Family at bedside. 

## 2017-10-27 NOTE — ED Triage Notes (Signed)
Sore throat, fever and chills for a week. She was seen at [facility] and had a negative strep. She took Prednisone and a Zpak.

## 2017-10-27 NOTE — Progress Notes (Signed)
Subjective:  Patient ID: Amy Lambert, female    DOB: Sep 29, 1995  Age: 22 y.o. MRN: [id number]  CC: Establish Care (est care/fever,off balance and fever this morning. went to urgent care 3 days ago,got z pak,prednison. )  Fever   This is a new problem. The current episode started yesterday. The problem occurs constantly. The problem has been waxing and waning. The maximum temperature noted was 102 to 102.9 F. The temperature was taken using an oral thermometer. Associated symptoms include headaches, muscle aches, nausea, sleepiness and a sore throat. Pertinent negatives include no congestion, coughing, rash or vomiting. She has tried NSAIDs and acetaminophen for the symptoms. The treatment provided no relief.  Risk factors: recent sickness   Risk factors: no recent travel   tested for strep and mononucleosis at urgent care clinic on Friday. Tests negative per patient. Throat culture pending. Oral prednisone and azithromycin were prescribed on Friday which she started taking.  Reviewed past Medical, Social and Family history today.  Outpatient Medications Prior to Visit  Medication Sig Dispense Refill  . etonogestrel (NEXPLANON) 68 MG IMPL implant 1 each by Subdermal route once.    . IBUPROFEN PO Take by mouth.    . predniSONE (DELTASONE) 20 MG tablet TK 2 TS PO QAM  0  . Diclofenac Sodium CR (VOLTAREN-XR) 100 MG 24 hr tablet Take 1 tablet (100 mg total) by mouth daily. (Patient not taking: Reported on 10/27/2017) 10 tablet 0  . ondansetron (ZOFRAN ODT) 8 MG disintegrating tablet 8mg  ODT q8 hours prn nausea (Patient not taking: Reported on 10/27/2017) 8 tablet 0  . oxyCODONE-acetaminophen (PERCOCET) 5-325 MG tablet Take 1 tablet by mouth every 6 (six) hours as needed. (Patient not taking: Reported on 10/27/2017) 10 tablet 0  . tamsulosin (FLOMAX) 0.4 MG CAPS capsule Take 1 capsule (0.4 mg total) by mouth daily. (Patient not taking: Reported on 10/27/2017) 30 capsule 0   No  facility-administered medications prior to visit.     ROS See HPI  Objective:  BP (!) 138/100   Pulse (!) 138   Temp (!) 102.9 F (39.4 C) (Oral)   Ht 5' (1.524 m)   Wt 177 lb (80.3 kg)   SpO2 97%   BMI 34.57 kg/m   BP Readings from Last 3 Encounters:  10/27/17 (!) 138/100  02/06/17 (!) 109/58  09/25/16 110/76    Wt Readings from Last 3 Encounters:  10/27/17 177 lb (80.3 kg)  02/05/17 176 lb 2.4 oz (79.9 kg)  09/25/16 162 lb 12.8 oz (73.8 kg)    Physical Exam  Constitutional: She is oriented to person, place, and time.  HENT:  Mouth/Throat: Uvula is midline. Oropharyngeal exudate and posterior oropharyngeal erythema present. Tonsils are 3+ on the right. Tonsils are 3+ on the left.  Neck: Normal range of motion. Neck supple.  Cardiovascular: Normal rate and regular rhythm.  Pulmonary/Chest: Effort normal and breath sounds normal. No respiratory distress.  Lymphadenopathy:    She has cervical adenopathy.  Neurological: She is alert and oriented to person, place, and time.  Psychiatric: She has a normal mood and affect. Her behavior is normal.  Vitals reviewed.   Lab Results  Component Value Date   WBC 12.0 (H) 02/05/2017   HGB 10.6 (L) 02/05/2017   HCT 32.9 (L) 02/05/2017   PLT 295 02/05/2017   GLUCOSE 106 (H) 02/05/2017   ALT 16 02/05/2017   AST 18 02/05/2017   NA 137 02/05/2017   K 3.4 (L) 02/05/2017   CL 106  02/05/2017   CREATININE 0.82 02/05/2017   BUN 9 02/05/2017   CO2 25 02/05/2017   INR 1.02 12/11/2011    Ct Renal Stone Study  Result Date: 02/05/2017 CLINICAL DATA:  Left flank pain for 3 hours EXAM: CT ABDOMEN AND PELVIS WITHOUT CONTRAST TECHNIQUE: Multidetector CT imaging of the abdomen and pelvis was performed following the standard protocol without IV contrast. COMPARISON:  CT 11/26/2014 FINDINGS: Lower chest: No acute abnormality. Hepatobiliary: No focal liver abnormality is seen. No gallstones, gallbladder wall thickening, or biliary  dilatation. Pancreas: Unremarkable. No pancreatic ductal dilatation or surrounding inflammatory changes. Spleen: Normal in size without focal abnormality. Adrenals/Urinary Tract: Adrenal glands are within normal limits. Increased density at the right renal pyramids. Multiple punctate intrarenal stones bilaterally. Minimal left hydronephrosis and prominence of left ureter, secondary to a 3 mm stone in the distal left ureter, just above the left UVJ. The bladder is normal. Stomach/Bowel: Stomach is within normal limits. Appendix appears normal. No evidence of bowel wall thickening, distention, or inflammatory changes. Vascular/Lymphatic: No significant vascular findings are present. No enlarged abdominal or pelvic lymph nodes. Reproductive: Uterus unremarkable.  3.6 cm cyst in the left ovary. Other: No abdominal wall hernia or abnormality. No abdominopelvic ascites. Musculoskeletal: No acute or significant osseous findings. IMPRESSION: 1. Minimal left hydronephrosis and hydroureter secondary to a 3 mm stone in the distal left ureter, just above the left UVJ 2. 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   On: 02/05/2017 23:10    Assessment & Plan:   Amy Lambert was seen today for establish care.  Diagnoses and all orders for this visit:  Fever and chills -     ketorolac (TORADOL) 30 MG/ML injection 30 mg -     POC Influenza A&B(BINAX/QUICKVUE)  Dehydration  Acute pharyngitis, unspecified etiology   I have discontinued Amy Lambert's Diclofenac Sodium CR, oxyCODONE-acetaminophen, ondansetron, and tamsulosin. I am also having her maintain her etonogestrel, predniSONE, and IBUPROFEN PO. We will continue to administer ketorolac.  Meds ordered this encounter  Medications  . ketorolac (TORADOL) 30 MG/ML injection 30 mg   Follow-up: Return if symptoms worsen or fail to  improve.  Amy Penna, NP

## 2017-10-27 NOTE — ED Provider Notes (Signed)
[facility] EMERGENCY DEPARTMENT Provider Note   CSN: [id number] Arrival date & time: 10/27/17  1124     History   Chief Complaint Chief Complaint  Patient presents with  . Sore Throat  . Fever    HPI Amy Lambert is a 22 y.o. female.  Patient presents the emergency department from her doctor's office today with complaint of fever, sore throat ongoing for 6 to 7 days.  Patient was found to have high fever of 102.9 F and a pulse rate of 138.  She was sent to the emergency department for sepsis work-up.  Patient was initially seen last week after having symptoms for approximately 2 days.  She had a negative strep at that time was placed on a 5-day course of azithromycin which she has completed.  She was also given prednisone which she has been taking.  Symptoms waxed and waned but became worse yesterday prompting follow-up today.  She denies ear pain, nasal congestion, chest pain.  She has had nausea but no vomiting.  No abdominal pain, urinary symptoms, or rash.  No significant headache or confusion.  She also had a negative mononucleosis test.  No known sick contacts. The onset of this condition was acute. Aggravating factors: swallowing. Alleviating factors: none.       Past Medical History:  Diagnosis Date  . Dysmenorrhea   . Menorrhagia   . UTI (lower urinary tract infection)     Patient Active Problem List   Diagnosis Date Noted  . Menorrhagia   . Dysmenorrhea     History reviewed. No pertinent surgical history.   OB History    Gravida  0   Para  0   Term  0   Preterm  0   AB  0   Living  0     SAB  0   TAB  0   Ectopic  0   Multiple  0   Live Births               Home Medications    Prior to Admission medications   Medication Sig Start Date End Date Taking? Authorizing Provider  etonogestrel (NEXPLANON) 68 MG IMPL implant 1 each by Subdermal route once.   Yes [provider]  IBUPROFEN PO Take by mouth.   Yes  [provider]  predniSONE (DELTASONE) 20 MG tablet TK 2 TS PO QAM 10/23/17  Yes [provider]    Family History Family History  Problem Relation Age of Onset  . Hypertension Paternal Grandmother   . Diabetes Maternal Grandmother   . Hypertension Maternal Grandmother   . Heart attack Maternal Grandmother     Social History Social History   Tobacco Use  . Smoking status: Never Smoker  . Smokeless tobacco: Never Used  Substance Use Topics  . Alcohol use: Yes    Comment: occ  . Drug use: No     Allergies   Penicillins   Review of Systems Review of Systems  Constitutional: Positive for chills, fatigue and fever.  HENT: Positive for sore throat and trouble swallowing. Negative for rhinorrhea.   Eyes: Negative for redness.  Respiratory: Negative for cough and shortness of breath.   Cardiovascular: Negative for chest pain.  Gastrointestinal: Positive for nausea. Negative for abdominal pain, diarrhea and vomiting.  Genitourinary: Negative for dysuria.  Musculoskeletal: Negative for myalgias.  Skin: Negative for rash.  Neurological: Positive for dizziness. Negative for headaches.     Physical Exam Updated Vital Signs  BP 105/68   Pulse (!) 125   Temp (!) 101.9 F (38.8 C) (Oral)   Resp 20   Ht 5' (1.524 m)   Wt 82 kg   SpO2 99%   BMI 35.31 kg/m   Physical Exam  Constitutional: She appears well-developed and well-nourished.  HENT:  Head: Normocephalic and atraumatic.  Right Ear: Tympanic membrane and ear canal normal.  Left Ear: Tympanic membrane and ear canal normal.  Nose: Nose normal.  Mouth/Throat: Oropharyngeal exudate, posterior oropharyngeal edema and posterior oropharyngeal erythema present. No tonsillar abscesses. Tonsils are 4+ on the right. Tonsils are 4+ on the left. Tonsillar exudate.  Trace effusion bilaterally without signs of otitis media.  No peritonsillar abscess or uvular deviation.  Eyes: Conjunctivae are normal. Right  eye exhibits no discharge. Left eye exhibits no discharge.  Neck: Normal range of motion. Neck supple.  No signs of meningismus.  Cardiovascular: Regular rhythm and normal heart sounds.  Tachycardia  Pulmonary/Chest: Effort normal and breath sounds normal.  Abdominal: Soft. There is no tenderness.  Neurological: She is alert.  Skin: Skin is warm and dry.  Psychiatric: She has a normal mood and affect.  Nursing note and vitals reviewed.    ED Treatments / Results  Labs (all labs ordered are listed, but only abnormal results are displayed) Labs Reviewed  CBC WITH DIFFERENTIAL/PLATELET - Abnormal; Notable for the following components:      Result Value   WBC 11.5 (*)    Hemoglobin 11.9 (*)    Neutro Abs 9.4 (*)    Abs Immature Granulocytes 0.11 (*)    All other components within normal limits  COMPREHENSIVE METABOLIC PANEL - Abnormal; Notable for the following components:   Sodium 132 (*)    Calcium 8.8 (*)    Total Protein 8.2 (*)    AST 13 (*)    All other components within normal limits  GROUP A STREP BY PCR  CULTURE, BLOOD (ROUTINE X 2)  CULTURE, BLOOD (ROUTINE X 2)  URINALYSIS, ROUTINE W REFLEX MICROSCOPIC  PREGNANCY, URINE  I-STAT CG4 LACTIC ACID, ED    EKG None  Radiology No results found.  Procedures Procedures (including critical care time)  Medications Ordered in ED Medications  ibuprofen (ADVIL,MOTRIN) tablet 400 mg (400 mg Oral Given 10/27/17 1138)  sodium chloride 0.9 % bolus 1,000 mL (0 mLs Intravenous Stopped 10/27/17 1327)  sodium chloride 0.9 % bolus 1,000 mL (0 mLs Intravenous Stopped 10/27/17 1327)  dexamethasone (DECADRON) injection 10 mg (10 mg Intravenous Given 10/27/17 1415)     Initial Impression / Assessment and Plan / ED Course  I have reviewed the triage vital signs and the nursing notes.  Pertinent labs & imaging results that were available during my care of the patient were reviewed by me and considered in my medical decision making  (see chart for details).     Patient seen and examined.  Patient is well-appearing with clear speech and normal mentation.  Work-up initiated.  Vital signs reviewed and are as follows: BP 105/68   Pulse (!) 125   Temp (!) 101.9 F (38.8 C) (Oral)   Resp 20   Ht 5' (1.524 m)   Wt 82 kg   SpO2 99%   BMI 35.31 kg/m   Patient has received fluids, decadron -- feels better. Discussed lab results, pending blood cultures.   Patient urged to return with worsening symptoms or other concerns. Patient verbalized understanding and agrees with plan.   ENT f/u given -- pt  has recurrent tonsillitis and some element of obstruction from large tonsils. She would benefit from eval.    Final Clinical Impressions(s) / ED Diagnoses   Final diagnoses:  Tonsillitis   Patient with tonsillitis, feels better after treatment here.  Work-up negative.  She has been on antibiotics without improvement.  No concerns for severe sepsis today.  Sent for work-up by PCP and she is safe to follow-up with them.  No peritonsillar abscess.  Doubt RPA given no immunocompromise and good neck ROM.  Doubt epiglottitis given presentation.   ED Discharge Orders    None       Renne Crigler, PA-C 10/27/17 1443    Alvira Monday, MD 10/27/17 [phone]

## 2017-10-27 NOTE — ED Notes (Signed)
ED Provider at bedside. 

## 2017-11-01 LAB — CULTURE, BLOOD (ROUTINE X 2)
CULTURE: NO GROWTH
Culture: NO GROWTH
Special Requests: ADEQUATE
Special Requests: ADEQUATE

## 2017-12-29 ENCOUNTER — Ambulatory Visit (INDEPENDENT_AMBULATORY_CARE_PROVIDER_SITE_OTHER): Payer: BC Managed Care – PPO | Admitting: Nurse Practitioner

## 2017-12-29 ENCOUNTER — Ambulatory Visit: Payer: BC Managed Care – PPO | Admitting: Nurse Practitioner

## 2017-12-29 ENCOUNTER — Encounter: Payer: Self-pay | Admitting: Nurse Practitioner

## 2017-12-29 VITALS — BP 116/82 | HR 120 | Temp 100.1°F | Ht 60.0 in | Wt 178.0 lb

## 2017-12-29 DIAGNOSIS — J101 Influenza due to other identified influenza virus with other respiratory manifestations: Secondary | ICD-10-CM

## 2017-12-29 LAB — POCT INFLUENZA A/B
Influenza A, POC: POSITIVE — AB
Influenza B, POC: POSITIVE — AB

## 2017-12-29 MED ORDER — OSELTAMIVIR PHOSPHATE 75 MG PO CAPS: 75.0000 mg | ORAL_CAPSULE | Freq: Two times a day (BID) | ORAL | 0 refills | Status: DC

## 2017-12-29 MED ORDER — CHLORPHEN-PE-ACETAMINOPHEN 4-10-325 MG PO TABS: 1.0000 | ORAL_TABLET | Freq: Two times a day (BID) | ORAL | 0 refills | Status: AC

## 2017-12-29 MED ORDER — GUAIFENESIN-DM 100-10 MG/5ML PO SYRP: 5.0000 mL | ORAL_SOLUTION | ORAL | 0 refills | Status: DC | PRN

## 2017-12-29 MED ORDER — BENZONATATE 100 MG PO CAPS: 100.0000 mg | ORAL_CAPSULE | Freq: Three times a day (TID) | ORAL | 0 refills | Status: DC | PRN

## 2017-12-29 NOTE — Progress Notes (Signed)
Subjective:  Patient ID: Amy Lambert, female    DOB: July 22, 1995  Age: 22 y.o. MRN: [id number]  CC: Cough (pt is c/o couging,fever,weakness,nausea,bodyache. took ibuprofen. )  URI   This is a new problem. The current episode started yesterday. The problem has been unchanged. The maximum temperature recorded prior to her arrival was 100.4 - 100.9 F. The fever has been present for less than 1 day. Associated symptoms include congestion, coughing, rhinorrhea, sinus pain and a sore throat. Pertinent negatives include no wheezing.   Reviewed past Medical, Social and Family history today.  Outpatient Medications Prior to Visit  Medication Sig Dispense Refill  . IBUPROFEN PO Take by mouth.    . etonogestrel (NEXPLANON) 68 MG IMPL implant 1 each by Subdermal route once.    . predniSONE (DELTASONE) 20 MG tablet TK 2 TS PO QAM  0   No facility-administered medications prior to visit.     ROS See HPI  Objective:  BP 116/82   Pulse (!) 120   Temp 100.1 F (37.8 C) (Oral)   Ht 5' (1.524 m)   Wt 178 lb (80.7 kg)   SpO2 97%   BMI 34.76 kg/m   BP Readings from Last 3 Encounters:  12/29/17 116/82  10/27/17 110/75  10/27/17 (!) 138/100    Wt Readings from Last 3 Encounters:  12/29/17 178 lb (80.7 kg)  10/27/17 180 lb 12.4 oz (82 kg)  10/27/17 177 lb (80.3 kg)    Physical Exam  Constitutional: She is oriented to person, place, and time.  HENT:  Right Ear: Tympanic membrane, external ear and ear canal normal.  Left Ear: Tympanic membrane, external ear and ear canal normal.  Nose: Mucosal edema and rhinorrhea present. Right sinus exhibits maxillary sinus tenderness. Right sinus exhibits no frontal sinus tenderness. Left sinus exhibits maxillary sinus tenderness. Left sinus exhibits no frontal sinus tenderness.  Mouth/Throat: Uvula is midline. No trismus in the jaw. Posterior oropharyngeal erythema present. No oropharyngeal exudate.  Eyes: No scleral icterus.  Neck: Normal range of  motion. Neck supple.  Cardiovascular: Normal rate and normal heart sounds.  Pulmonary/Chest: Effort normal and breath sounds normal.  Musculoskeletal: She exhibits no edema.  Lymphadenopathy:    She has no cervical adenopathy.  Neurological: She is alert and oriented to person, place, and time.  Psychiatric: She has a normal mood and affect. Her behavior is normal. Thought content normal.  Vitals reviewed.   Lab Results  Component Value Date   WBC 11.5 (H) 10/27/2017   HGB 11.9 (L) 10/27/2017   HCT 36.7 10/27/2017   PLT 310 10/27/2017   GLUCOSE 91 10/27/2017   ALT 13 10/27/2017   AST 13 (L) 10/27/2017   NA 132 (L) 10/27/2017   K 3.6 10/27/2017   CL 100 10/27/2017   CREATININE 0.85 10/27/2017   BUN 11 10/27/2017   CO2 24 10/27/2017   INR 1.02 12/11/2011    Assessment & Plan:   Eadie was seen today for cough.  Diagnoses and all orders for this visit:  Influenza A -     POCT Influenza A/B -     oseltamivir (TAMIFLU) 75 MG capsule; Take 1 capsule (75 mg total) by mouth 2 (two) times daily. -     benzonatate (TESSALON) 100 MG capsule; Take 1 capsule (100 mg total) by mouth 3 (three) times daily as needed for cough. -     guaiFENesin-dextromethorphan (ROBITUSSIN DM) 100-10 MG/5ML syrup; Take 5 mLs by mouth every 4 (four) hours as  needed for cough. -     Chlorphen-PE-Acetaminophen 4-10-325 MG TABS; Take 1 tablet by mouth every 12 (twelve) hours for 3 days.  Influenza B -     oseltamivir (TAMIFLU) 75 MG capsule; Take 1 capsule (75 mg total) by mouth 2 (two) times daily. -     benzonatate (TESSALON) 100 MG capsule; Take 1 capsule (100 mg total) by mouth 3 (three) times daily as needed for cough. -     guaiFENesin-dextromethorphan (ROBITUSSIN DM) 100-10 MG/5ML syrup; Take 5 mLs by mouth every 4 (four) hours as needed for cough. -     Chlorphen-PE-Acetaminophen 4-10-325 MG TABS; Take 1 tablet by mouth every 12 (twelve) hours for 3 days.   I am having Amy GoodNaryah Lambert start on  oseltamivir, benzonatate, guaiFENesin-dextromethorphan, and Chlorphen-PE-Acetaminophen. I am also having her maintain her etonogestrel, predniSONE, and IBUPROFEN PO.  Meds ordered this encounter  Medications  . oseltamivir (TAMIFLU) 75 MG capsule    Sig: Take 1 capsule (75 mg total) by mouth 2 (two) times daily.    Dispense:  10 capsule    Refill:  0    Order Specific Question:   Supervising Provider    Answer:   MATTHEWS, CODY [[id number]]  . benzonatate (TESSALON) 100 MG capsule    Sig: Take 1 capsule (100 mg total) by mouth 3 (three) times daily as needed for cough.    Dispense:  20 capsule    Refill:  0    Order Specific Question:   Supervising Provider    Answer:   MATTHEWS, CODY [[id number]]  . guaiFENesin-dextromethorphan (ROBITUSSIN DM) 100-10 MG/5ML syrup    Sig: Take 5 mLs by mouth every 4 (four) hours as needed for cough.    Dispense:  118 mL    Refill:  0    Order Specific Question:   Supervising Provider    Answer:   MATTHEWS, CODY [[id number]]  . Chlorphen-PE-Acetaminophen 4-10-325 MG TABS    Sig: Take 1 tablet by mouth every 12 (twelve) hours for 3 days.    Dispense:  6 tablet    Refill:  0    Order Specific Question:   Supervising Provider    Answer:   MATTHEWS, CODY [[id number]]    Problem List Items Addressed This Visit    None    Visit Diagnoses    Influenza A    -  Primary   Relevant Medications   oseltamivir (TAMIFLU) 75 MG capsule   benzonatate (TESSALON) 100 MG capsule   guaiFENesin-dextromethorphan (ROBITUSSIN DM) 100-10 MG/5ML syrup   Chlorphen-PE-Acetaminophen 4-10-325 MG TABS   Other Relevant Orders   POCT Influenza A/B (Completed)   Influenza B       Relevant Medications   oseltamivir (TAMIFLU) 75 MG capsule   benzonatate (TESSALON) 100 MG capsule   guaiFENesin-dextromethorphan (ROBITUSSIN DM) 100-10 MG/5ML syrup   Chlorphen-PE-Acetaminophen 4-10-325 MG TABS       Follow-up: Return if symptoms worsen or fail to improve.  Alysia Pennaharlotte Pj Zehner, NP

## 2017-12-29 NOTE — Patient Instructions (Signed)

## 2018-03-02 ENCOUNTER — Ambulatory Visit: Payer: BC Managed Care – PPO | Admitting: Nurse Practitioner

## 2018-05-07 ENCOUNTER — Ambulatory Visit: Payer: BC Managed Care – PPO | Admitting: Nurse Practitioner

## 2018-06-21 ENCOUNTER — Encounter: Payer: BC Managed Care – PPO | Admitting: Nurse Practitioner

## 2018-06-25 ENCOUNTER — Encounter: Payer: BC Managed Care – PPO | Admitting: Nurse Practitioner

## 2018-09-01 ENCOUNTER — Other Ambulatory Visit: Payer: Self-pay

## 2018-09-01 DIAGNOSIS — Z20822 Contact with and (suspected) exposure to covid-19: Secondary | ICD-10-CM

## 2018-09-02 LAB — NOVEL CORONAVIRUS, NAA: SARS-CoV-2, NAA: NOT DETECTED

## 2018-09-24 ENCOUNTER — Emergency Department (HOSPITAL_COMMUNITY): Payer: BC Managed Care – PPO

## 2018-09-24 ENCOUNTER — Encounter: Payer: Self-pay | Admitting: Family Medicine

## 2018-09-24 ENCOUNTER — Emergency Department (HOSPITAL_COMMUNITY)
Admission: EM | Admit: 2018-09-24 | Discharge: 2018-09-24 | Disposition: A | Discharge status: Home / Self Care | Payer: BC Managed Care – PPO | Attending: Emergency Medicine | Admitting: Emergency Medicine

## 2018-09-24 ENCOUNTER — Ambulatory Visit: Payer: BC Managed Care – PPO | Admitting: Family Medicine

## 2018-09-24 ENCOUNTER — Other Ambulatory Visit: Payer: Self-pay

## 2018-09-24 VITALS — BP 110/78 | HR 90 | Temp 98.4°F | Ht 60.0 in | Wt 184.4 lb

## 2018-09-24 DIAGNOSIS — K429 Umbilical hernia without obstruction or gangrene: Secondary | ICD-10-CM

## 2018-09-24 DIAGNOSIS — R109 Unspecified abdominal pain: Secondary | ICD-10-CM | POA: Diagnosis not present

## 2018-09-24 DIAGNOSIS — Z79899 Other long term (current) drug therapy: Secondary | ICD-10-CM | POA: Insufficient documentation

## 2018-09-24 DIAGNOSIS — K529 Noninfective gastroenteritis and colitis, unspecified: Secondary | ICD-10-CM

## 2018-09-24 DIAGNOSIS — K420 Umbilical hernia with obstruction, without gangrene: Secondary | ICD-10-CM | POA: Diagnosis not present

## 2018-09-24 LAB — COMPREHENSIVE METABOLIC PANEL
ALT: 11 U/L (ref 0–44)
AST: 16 U/L (ref 15–41)
Albumin: 3.7 g/dL (ref 3.5–5.0)
Alkaline Phosphatase: 64 U/L (ref 38–126)
Anion gap: 10 (ref 5–15)
BUN: 9 mg/dL (ref 6–20)
CO2: 22 mmol/L (ref 22–32)
Calcium: 9.1 mg/dL (ref 8.9–10.3)
Chloride: 106 mmol/L (ref 98–111)
Creatinine, Ser: 0.89 mg/dL (ref 0.44–1.00)
GFR calc Af Amer: >60 mL/min (ref >60)
GFR calc non Af Amer: >60 mL/min (ref >60)
Glucose, Bld: 80 mg/dL (ref 70–99)
Potassium: 3.9 mmol/L (ref 3.5–5.1)
Sodium: 138 mmol/L (ref 135–145)
Total Bilirubin: 0.6 mg/dL (ref 0.3–1.2)
Total Protein: 8.1 g/dL (ref 6.5–8.1)

## 2018-09-24 LAB — URINALYSIS, ROUTINE W REFLEX MICROSCOPIC
Bilirubin Urine: NEGATIVE
Glucose, UA: NEGATIVE mg/dL
Hgb urine dipstick: NEGATIVE
Ketones, ur: NEGATIVE mg/dL
Leukocytes,Ua: NEGATIVE
Nitrite: NEGATIVE
Protein, ur: NEGATIVE mg/dL
Specific Gravity, Urine: 1.014 (ref 1.005–1.030)
pH: 6 (ref 5.0–8.0)

## 2018-09-24 LAB — CBC
HCT: 36.5 % (ref 36.0–46.0)
Hemoglobin: 11.5 g/dL — ABNORMAL LOW (ref 12.0–15.0)
MCH: 27.6 pg (ref 26.0–34.0)
MCHC: 31.5 g/dL (ref 30.0–36.0)
MCV: 87.7 fL (ref 80.0–100.0)
Platelets: 358 10*3/uL (ref 150–400)
RBC: 4.16 MIL/uL (ref 3.87–5.11)
RDW: 14.8 % (ref 11.5–15.5)
WBC: 6.6 10*3/uL (ref 4.0–10.5)
nRBC: 0 % (ref 0.0–0.2)

## 2018-09-24 LAB — I-STAT BETA HCG BLOOD, ED (MC, WL, AP ONLY): I-stat hCG, quantitative: <5 m[IU]/mL (ref <5)

## 2018-09-24 LAB — LIPASE, BLOOD: Lipase: 42 U/L (ref 11–51)

## 2018-09-24 MED ORDER — AZITHROMYCIN 500 MG PO TABS: 1000.0000 mg | ORAL_TABLET | Freq: Once | ORAL | 0 refills | Status: AC

## 2018-09-24 MED ORDER — ONDANSETRON HCL 4 MG PO TABS: 4.0000 mg | ORAL_TABLET | Freq: Four times a day (QID) | ORAL | 0 refills | Status: DC

## 2018-09-24 MED ORDER — MORPHINE SULFATE (PF) 4 MG/ML IV SOLN: 4.0000 mg | Freq: Once | INTRAVENOUS | Status: DC

## 2018-09-24 MED ORDER — SODIUM CHLORIDE 0.9% FLUSH: 3.0000 mL | Freq: Once | INTRAVENOUS | Status: DC

## 2018-09-24 MED ORDER — IOHEXOL 300 MG/ML  SOLN
100.0000 mL | Freq: Once | INTRAMUSCULAR | Status: AC | PRN
  Administered 2018-09-24: 100 mL via INTRAVENOUS

## 2018-09-24 MED ORDER — DIAZEPAM 2 MG PO TABS
2.0000 mg | ORAL_TABLET | Freq: Once | ORAL | Status: AC
  Administered 2018-09-24: 2 mg via ORAL
  Filled 2018-09-24: qty 1

## 2018-09-24 MED ORDER — HYDROCODONE-ACETAMINOPHEN 5-325 MG PO TABS: 1.0000 | ORAL_TABLET | Freq: Four times a day (QID) | ORAL | 0 refills | Status: DC | PRN

## 2018-09-24 NOTE — ED Triage Notes (Signed)
Pt presents with mid abd pain starting last night associated with nausea this am. Pt' physician sent her here for evaluation and surgery.

## 2018-09-24 NOTE — ED Triage Notes (Signed)
States saw her dr Adrian Prows am and she told him about her umbilical hernia, he pressed on it and  It hurt really bad states she has had some pressure when she pees but otherwise no other issues , states doesn't really want surgery today

## 2018-09-24 NOTE — Discharge Instructions (Addendum)
Take azithromycin as prescribed with food.  Take Vicodin every 6 hours as needed for severe pain.  Take Zofran every 6 hours as needed for nausea or vomiting.  Please follow-up with [facility] for further evaluation of your hernia.

## 2018-09-24 NOTE — ED Provider Notes (Signed)
[facility] EMERGENCY DEPARTMENT Provider Note   CSN: [id number] Arrival date & time: 09/24/18  1130     History   Chief Complaint Chief Complaint  Patient presents with   Hernia    HPI Aveline Murgia is a 23 y.o. female with history of IDA who presents from primary care provider for evaluation of umbilical hernia.  Patient reports she has noticed an umbilical hernia over the past few months, however it always went back in.  Patient reports today she began having severe pain to the area and nausea.  She has had loose stools that are nonbloody.  She was seen by her PCP who called general surgery and advised him to send her to the emergency department for evaluation.  Patient did not take any medication prior to arrival.  She has had some pressure when she urinates.  She denies any fever, chest pain, shortness of breath.     HPI  Past Medical History:  Diagnosis Date   Dysmenorrhea    Menorrhagia    UTI (lower urinary tract infection)     Patient Active Problem List   Diagnosis Date Noted   Umbilical hernia, incarcerated 09/24/2018   Menorrhagia    Dysmenorrhea     No past surgical history on file.   OB History    Gravida  0   Para  0   Term  0   Preterm  0   AB  0   Living  0     SAB  0   TAB  0   Ectopic  0   Multiple  0   Live Births               Home Medications    Prior to Admission medications   Medication Sig Start Date End Date Taking? Authorizing Provider  azithromycin (ZITHROMAX) 500 MG tablet Take 2 tablets (1,000 mg total) by mouth once for 1 dose. Take first 2 tablets together, then 1 every day until finished. 09/24/18 09/24/18  Lucille Witts, Waylan Boga, PA-C  etonogestrel (NEXPLANON) 68 MG IMPL implant 1 each by Subdermal route once.    [provider]  HYDROcodone-acetaminophen (NORCO/VICODIN) 5-325 MG tablet Take 1-2 tablets by mouth every 6 (six) hours as needed for severe pain. 09/24/18   Syana Degraffenreid, Waylan Boga,  PA-C  ondansetron (ZOFRAN) 4 MG tablet Take 1 tablet (4 mg total) by mouth every 6 (six) hours. 09/24/18   Emi Holes, PA-C    Family History Family History  Problem Relation Age of Onset   Hypertension Paternal Grandmother    Diabetes Maternal Grandmother    Hypertension Maternal Grandmother    Heart attack Maternal Grandmother     Social History Social History   Tobacco Use   Smoking status: Never Smoker   Smokeless tobacco: Never Used  Substance Use Topics   Alcohol use: Yes    Comment: occ   Drug use: No     Allergies   Penicillins   Review of Systems Review of Systems  Constitutional: Negative for chills and fever.  HENT: Negative for facial swelling and sore throat.   Respiratory: Negative for shortness of breath.   Cardiovascular: Negative for chest pain.  Gastrointestinal: Positive for abdominal pain, diarrhea and nausea. Negative for blood in stool and vomiting.  Genitourinary: Positive for dysuria.  Musculoskeletal: Negative for back pain.  Skin: Negative for rash and wound.  Neurological: Negative for headaches.  Psychiatric/Behavioral: The patient is not nervous/anxious.  Physical Exam Updated Vital Signs BP 110/66    Pulse 65    Temp 99.3 F (37.4 C) (Oral)    Resp 16    Ht 5' (1.524 m)    Wt 83.5 kg    SpO2 100%    BMI 35.94 kg/m   Physical Exam Vitals signs and nursing note reviewed.  Constitutional:      General: She is not in acute distress.    Appearance: She is well-developed. She is not diaphoretic.  HENT:     Head: Normocephalic and atraumatic.     Mouth/Throat:     Pharynx: No oropharyngeal exudate.  Eyes:     General: No scleral icterus.       Right eye: No discharge.        Left eye: No discharge.     Conjunctiva/sclera: Conjunctivae normal.     Pupils: Pupils are equal, round, and reactive to light.  Neck:     Musculoskeletal: Normal range of motion and neck supple.     Thyroid: No thyromegaly.    Cardiovascular:     Rate and Rhythm: Normal rate and regular rhythm.     Heart sounds: Normal heart sounds. No murmur. No friction rub. No gallop.   Pulmonary:     Effort: Pulmonary effort is normal. No respiratory distress.     Breath sounds: Normal breath sounds. No stridor. No wheezing or rales.  Abdominal:     General: Bowel sounds are normal. There is no distension.     Palpations: Abdomen is soft.     Tenderness: There is abdominal tenderness in the periumbilical area. There is no guarding or rebound.     Hernia: A hernia is present. Hernia is present in the umbilical area.     Comments: Small (~1cm), very tender bulge at the umbilicus; mobile, but not reducible  Lymphadenopathy:     Cervical: No cervical adenopathy.  Skin:    General: Skin is warm and dry.     Coloration: Skin is not pale.     Findings: No rash.  Neurological:     Mental Status: She is alert.     Coordination: Coordination normal.      ED Treatments / Results  Labs (all labs ordered are listed, but only abnormal results are displayed) Labs Reviewed  CBC - Abnormal; Notable for the following components:      Result Value   Hemoglobin 11.5 (*)    All other components within normal limits  LIPASE, BLOOD  COMPREHENSIVE METABOLIC PANEL  URINALYSIS, ROUTINE W REFLEX MICROSCOPIC  I-STAT BETA HCG BLOOD, ED ([facility], [facility], [facility] ONLY)    EKG None  Radiology Ct Abdomen Pelvis W Contrast  Result Date: 09/24/2018 CLINICAL DATA:  Pt complains of pain around umbilical area, has known hernia. EXAM: CT ABDOMEN AND PELVIS WITH CONTRAST TECHNIQUE: Multidetector CT imaging of the abdomen and pelvis was performed using the standard protocol following bolus administration of intravenous contrast. CONTRAST:  100mL OMNIPAQUE IOHEXOL 300 MG/ML  SOLN COMPARISON:  02/06/2017 FINDINGS: Lower chest: No acute abnormality. Hepatobiliary: No focal liver abnormality is seen. No radiopaque gallstones, biliary dilatation, or pericholecystic  inflammatory changes. Pancreas: Unremarkable. No pancreatic ductal dilatation or surrounding inflammatory changes. Spleen: Normal in size without focal abnormality. Adrenals/Urinary Tract: Adrenal glands are normal. The kidneys are symmetric in size and enhancement. No hydronephrosis. Tiny LEFT renal cyst measures less than 1 centimeter. Ureters are unremarkable. The bladder and visualized portion of the urethra are normal. Stomach/Bowel: Stomach and small  bowel loops are normal in appearance. The appendix is well seen and has a normal appearance. Moderate stool burden. The ascending colon is notable for wall thickening to the level of the hepatic flexure. The remainder of the colon appears normal in wall thickness. There are scattered colonic diverticula but no acute diverticulitis. Vascular/Lymphatic: No significant vascular findings are present. No enlarged retroperitoneal lymph nodes. Mildly prominent bilateral inguinal lymph nodes. Reproductive: Uterus is present. Intrauterine device is identified in the central uterus, in the expected location. Other: No free pelvic fluid. There is a small paraumbilical hernia, with adjacent nondilated loop of small bowel. No bowel obstruction or large defect. Musculoskeletal: No acute or significant osseous findings. IMPRESSION: 1. Wall thickening and inflammatory changes of the ascending colon, consistent with colitis. 2. Colonic diverticulosis without acute diverticulitis. 3. Small anterior abdominal wall hernia with adjacent nondilated small bowel. 4. Normal appendix. 5. Intrauterine device in appropriate position. 6. Likely reactive bilateral inguinal lymph nodes. Electronically Signed   By: Nolon Nations M.D.   On: 09/24/2018 14:26    Procedures Procedures (including critical care time)  Medications Ordered in ED Medications  sodium chloride flush (NS) 0.9 % injection 3 mL (has no administration in time range)  morphine 4 MG/ML injection 4 mg (has no  administration in time range)  diazepam (VALIUM) tablet 2 mg (2 mg Oral Given 09/24/18 1300)  iohexol (OMNIPAQUE) 300 MG/ML solution 100 mL (100 mLs Intravenous Contrast Given 09/24/18 1357)     Initial Impression / Assessment and Plan / ED Course  I have reviewed the triage vital signs and the nursing notes.  Pertinent labs & imaging results that were available during my care of the patient were reviewed by me and considered in my medical decision making (see chart for details).        Patient sent for evaluation of umbilical hernia for concern for incarceration.  Labs are unremarkable.  I discussed prior to CT scan with general surgery considering patient's PCP called surgery ahead, who advises to go ahead with a CT to assess.  CT returns with wall thickening inflammatory changes in the ascending colon consistent with colitis as well as a small anterior abdominal wall hernia with adjacent nondilated small bowel.  Patient's colitis could be viral or food related, however using shared decision making with the patient, will cover with a single dose of azithromycin for bacterial cause.  Will discharge home with short course of Vicodin for pain as well as Zofran.  Outpatient follow-up with general surgery discussed.  Return precautions discussed.  Patient and mother understand and agree with plan.  Patient vital stable throughout ED course and discharged in satisfactory condition.  Final Clinical Impressions(s) / ED Diagnoses   Final diagnoses:  Colitis  Umbilical hernia without obstruction and without gangrene    ED Discharge Orders         Ordered    azithromycin (ZITHROMAX) 500 MG tablet   Once     09/24/18 1522    HYDROcodone-acetaminophen (NORCO/VICODIN) 5-325 MG tablet  Every 6 hours PRN     09/24/18 1522    ondansetron (ZOFRAN) 4 MG tablet  Every 6 hours     09/24/18 1522           Frederica Kuster, PA-C 09/24/18 1559    Pattricia Boss, MD 09/24/18 [phone]

## 2018-09-24 NOTE — Patient Instructions (Signed)
Umbilical Hernia, Adult  A hernia is a bulge of tissue that pushes through an opening between muscles. An umbilical hernia happens in the abdomen, near the belly button (umbilicus). The hernia may contain tissues from the small intestine, large intestine, or fatty tissue covering the intestines (omentum). Umbilical hernias in adults tend to get worse over time, and they require surgical treatment. There are several types of umbilical hernias. You may have:  A hernia located just above or below the umbilicus (indirect hernia). This is the most common type of umbilical hernia in adults.  A hernia that forms through an opening formed by the umbilicus (direct hernia).  A hernia that comes and goes (reducible hernia). A reducible hernia may be visible only when you strain, lift something heavy, or cough. This type of hernia can be pushed back into the abdomen (reduced).  A hernia that traps abdominal tissue inside the hernia (incarcerated hernia). This type of hernia cannot be reduced.  A hernia that cuts off blood flow to the tissues inside the hernia (strangulated hernia). The tissues can start to die if this happens. This type of hernia requires emergency treatment. What are the causes? An umbilical hernia happens when tissue inside the abdomen presses on a weak area of the abdominal muscles. What increases the risk? You may have a greater risk of this condition if you:  Are obese.  Have had several pregnancies.  Have a buildup of fluid inside your abdomen (ascites).  Have had surgery that weakens the abdominal muscles. What are the signs or symptoms? The main symptom of this condition is a painless bulge at or near the belly button. A reducible hernia may be visible only when you strain, lift something heavy, or cough. Other symptoms may include:  Dull pain.  A feeling of pressure. Symptoms of a strangulated hernia may include:  Pain that gets increasingly worse.  Nausea and  vomiting.  Pain when pressing on the hernia.  Skin over the hernia becoming red or purple.  Constipation.  Blood in the stool. How is this diagnosed? This condition may be diagnosed based on:  A physical exam. You may be asked to cough or strain while standing. These actions increase the pressure inside your abdomen and force the hernia through the opening in your muscles. Your health care provider may try to reduce the hernia by pressing on it.  Your symptoms and medical history. How is this treated? Surgery is the only treatment for an umbilical hernia. Surgery for a strangulated hernia is done as soon as possible. If you have a small hernia that is not incarcerated, you may need to lose weight before having surgery. Follow these instructions at home:  Lose weight, if told by your health care provider.  Do not try to push the hernia back in.  Watch your hernia for any changes in color or size. Tell your health care provider if any changes occur.  You may need to avoid activities that increase pressure on your hernia.  Do not lift anything that is heavier than 10 lb (4.5 kg) until your health care provider says that this is safe.  Take over-the-counter and prescription medicines only as told by your health care provider.  Keep all follow-up visits as told by your health care provider. This is important. Contact a health care provider if:  Your hernia gets larger.  Your hernia becomes painful. Get help right away if:  You develop sudden, severe pain near the area of your hernia.    You have pain as well as nausea or vomiting.  You have pain and the skin over your hernia changes color.  You develop a fever. This information is not intended to replace advice given to you by your health care provider. Make sure you discuss any questions you have with your health care provider. Document Released: 06/08/2015 Document Revised: 02/18/2017 Document Reviewed: 07/07/2016 Elsevier  Patient Education  2020 Elsevier Inc.  

## 2018-09-24 NOTE — Assessment & Plan Note (Addendum)
-  Concern for incarcerated umbilical hernia given exam findings and amount of tenderness on exam.  Unable to reduce in clinic today.   -Called [facility] for urgent appt and recommended that she be seen by surgeon on call in ER.  -Labs ordered, however these would likely be repeated in ED so did not draw today.  -Instructed to go to [facility] or [facility], states they will go to [facility] ED.

## 2018-09-24 NOTE — Progress Notes (Signed)
Amy Lambert - 23 y.o. female MRN [id number]  Date of birth: Dec 24, 1995  Subjective Chief Complaint  Patient presents with  . Pain    pt states buldge off to the side of belly button/ painful/ nauseous     HPI Amy Lambert is a 23 y.o. female here today with complaint of umbilical pain.  She reports noticing bulge in umbilical region several months ago.  States that bulge would come and go up until yesterday when she noticed bulge was more prominent.  It has become very tender over the past 24 hours and she has developed nausea.  Stools have been loose.  She is unsure if she has had any blood in her stools. She denies fever or chills.  She denies urinary symptoms, vaginal discharge, flank pain.  She has IUD for contraception.    ROS:  A comprehensive ROS was completed and negative except as noted per HPI  Allergies  Allergen Reactions  . Penicillins Hives    & fever & fever    Past Medical History:  Diagnosis Date  . Dysmenorrhea   . Menorrhagia   . UTI (lower urinary tract infection)     No past surgical history on file.  Social History   Socioeconomic History  . Marital status: Single    Spouse name: Not on file  . Number of children: Not on file  . Years of education: Not on file  . Highest education level: Not on file  Occupational History  . Not on file  Social Needs  . Financial resource strain: Not on file  . Food insecurity    Worry: Not on file    Inability: Not on file  . Transportation needs    Medical: Not on file    Non-medical: Not on file  Tobacco Use  . Smoking status: Never Smoker  . Smokeless tobacco: Never Used  Substance and Sexual Activity  . Alcohol use: Yes    Comment: occ  . Drug use: No  . Sexual activity: Not on file  Lifestyle  . Physical activity    Days per week: Not on file    Minutes per session: Not on file  . Stress: Not on file  Relationships  . Social Musicianconnections    Talks on phone: Not on file    Gets together: Not  on file    Attends religious service: Not on file    Active member of club or organization: Not on file    Attends meetings of clubs or organizations: Not on file    Relationship status: Not on file  Other Topics Concern  . Not on file  Social History Narrative  . Not on file    Family History  Problem Relation Age of Onset  . Hypertension Paternal Grandmother   . Diabetes Maternal Grandmother   . Hypertension Maternal Grandmother   . Heart attack Maternal Grandmother     Health Maintenance  Topic Date Due  . HIV Screening  08/18/2010  . TETANUS/TDAP  08/18/2014  . PAP-Cervical Cytology Screening  08/17/2016  . PAP SMEAR-Modifier  08/17/2016  . INFLUENZA VACCINE  08/21/2018    ----------------------------------------------------------------------------------------------------------------------------------------------------------------------------------------------------------------- Physical Exam BP 110/78   Pulse 90   Temp 98.4 F (36.9 C) (Oral)   Ht 5' (1.524 m)   Wt 184 lb 6.4 oz (83.6 kg)   SpO2 99%   BMI 36.01 kg/m   Physical Exam Constitutional:      Appearance: Normal appearance.  HENT:  Head: Normocephalic and atraumatic.  Cardiovascular:     Rate and Rhythm: Normal rate and regular rhythm.  Pulmonary:     Effort: Pulmonary effort is normal.     Breath sounds: Normal breath sounds.  Abdominal:     General: Abdomen is flat.     Palpations: Abdomen is soft.     Tenderness: There is abdominal tenderness (very tender to palpation just inside umbilicus with small protrusion.  Unable to reduce due to tenderness and pain.  ). There is no right CVA tenderness or left CVA tenderness.     Hernia: A hernia is present.  Skin:    General: Skin is warm and dry.  Neurological:     General: No focal deficit present.     Mental Status: She is alert.  Psychiatric:        Mood and Affect: Mood normal.        Behavior: Behavior normal.      ------------------------------------------------------------------------------------------------------------------------------------------------------------------------------------------------------------------- Assessment and Plan  Umbilical hernia, incarcerated -Concern for incarcerated umbilical hernia given exam findings and amount of tenderness on exam.  Unable to reduce in clinic today.   -Called [facility] for urgent appt and recommended that she be seen by surgeon on call in ER.  -Labs ordered, however these would likely be repeated in ED so did not draw today.  -Instructed to go to [facility] or [facility], states they will go to [facility] ED.    >40 minutes spent with patient with 50% of time spent providing counseling and/or coordination of care for patient.

## 2018-09-29 ENCOUNTER — Ambulatory Visit: Payer: Self-pay | Admitting: General Surgery

## 2018-09-29 NOTE — H&P (Signed)
History of Present Illness Amy Lambert(Amy Brun MD; 09/29/2018 11:14 AM) The patient is a 23 year old female who presents with an umbilical hernia. Chief Complaint: Umbilical hernia  Patient is a 23 year old female who comes in secondary to umbilical hernia. Patient recently was seen ER secondary significant pain. She states this is been going on for 1-2 weeks. Patient states that the pain is with any type of Valsalva movements. 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 Patient recently underwent CT scan in the ER which revealed a small umbilical hernia. I did review this personally.  Patient had no previous abdominal surgery.    Past Surgical History Amy Lambert(Amy [place]; 09/29/2018 10:48 AM) Oral Surgery   Diagnostic Studies History Amy Lambert(Amy [place]; 09/29/2018 10:48 AM) Colonoscopy  never Pap Smear  never  Allergies Amy Lambert(Amy Lambert, CMA; 09/29/2018 10:50 AM) Penicillins  Allergies Reconciled   Medication History Amy Lambert(Amy Lambert, CMA; 09/29/2018 10:50 AM) Ondansetron HCl (4MG  Tablet, Oral) Active. Medications Reconciled  Social History Amy Lambert(Amy [place]; 09/29/2018 10:48 AM) Alcohol use  Occasional alcohol use. Caffeine use  Coffee, Tea. No drug use  Tobacco use  Never smoker.  Family History Amy Lambert(Amy Lambert[place]; 09/29/2018 10:48 AM) Heart disease in female family member before age 23  Migraine Headache  Mother.  Pregnancy / Birth History Amy Lambert(Amy Lambert[place]; 09/29/2018 10:48 AM) Age at menarche  12 years. Contraceptive History  Intrauterine device. Gravida  0 Irregular periods  Para  0  Other Problems Amy Lambert(Amy Lambert, [place]; 09/29/2018 10:48 AM) Gastroesophageal Reflux Disease  Kidney Stone  Migraine Headache     Review of Systems Amy Lambert(Amy Staubs MD; 09/29/2018 11:13 AM) General Present- Appetite Loss. Not Present- Chills, Fatigue, Fever, Night Sweats, Weight Gain and Weight Loss. HEENT Present-  Ringing in the Ears, Seasonal Allergies and Wears glasses/contact lenses. Not Present- Earache, Hearing Loss, Hoarseness, Nose Bleed, Oral Ulcers, Sinus Pain, Sore Throat, Visual Disturbances and Yellow Eyes. Respiratory Not Present- Bloody sputum, Chronic Cough, Difficulty Breathing, Snoring and Wheezing. Breast Present- Nipple Discharge. Not Present- Breast Mass, Breast Pain and Skin Changes. Cardiovascular Present- Leg Cramps and Swelling of Extremities. Not Present- Chest Pain, Difficulty Breathing Lying Down, Palpitations, Rapid Heart Rate and Shortness of Breath. Gastrointestinal Present- Abdominal Pain, Bloating, Constipation, Gets full quickly at meals and Nausea. Not Present- Bloody Stool, Change in Bowel Habits, Chronic diarrhea, Difficulty Swallowing, Excessive gas, Hemorrhoids, Indigestion, Rectal Pain and Vomiting. Female Genitourinary Present- Nocturia and Urgency. Not Present- Frequency, Painful Urination and Pelvic Pain. Musculoskeletal Present- Back Pain. Not Present- Joint Pain, Joint Stiffness, Muscle Pain, Muscle Weakness and Swelling of Extremities. Neurological Present- Headaches, Seizures, Tremor and Weakness. Not Present- Decreased Memory, Fainting, Numbness, Tingling and Trouble walking. Psychiatric Present- Depression. Not Present- Anxiety, Bipolar, Change in Sleep Pattern, Fearful and Frequent crying. Endocrine Present- Cold Intolerance. Not Present- Excessive Hunger, Hair Changes, Heat Intolerance, Hot flashes and New Diabetes. Hematology Present- Easy Bruising and Gland problems. Not Present- Blood Thinners, Excessive bleeding, HIV and Persistent Infections. All other systems negative  Vitals Amy Lambert(Amy Lambert CMA; 09/29/2018 10:50 AM) 09/29/2018 10:49 AM Weight: 183.2 lb Height: 63in Body Surface Area: 1.86 m Body Mass Index: 32.45 kg/m  Temp.: 97.48F  Pulse: 100 (Regular)  BP: 122/76(Sitting, Left Arm, Standard)       Physical Exam Amy Lambert(Amy Kurek MD;  09/29/2018 11:14 AM) The physical exam findings are as follows: Note: Constitutional: No acute distress, conversant, appears stated age  Eyes: Anicteric sclerae, moist conjunctiva, no lid lag  Neck: No thyromegaly, trachea midline, no cervical lymphadenopathy  Lungs: Clear to auscultation biilaterally, normal respiratory effot  Cardiovascular: regular rate & rhythm, no murmurs, no peripheal edema, pedal pulses 2+  GI: Soft, no masses or hepatosplenomegaly, non-tender to palpation  MSK: Normal gait, no clubbing cyanosis, edema  Skin: No rashes, palpation reveals normal skin turgor  Psychiatric: Appropriate judgment and insight, oriented to person, place, and time  Abdomen Inspection Hernias - Umbilical hernia - Reducible.    Assessment & Plan Amy Ok MD; 06/22/9474 54:65 AM)  UMBILICAL HERNIA WITHOUT OBSTRUCTION AND WITHOUT GANGRENE (K42.9) Impression: 23 year old female with small umbilical hernia. 1. The patient will like to proceed to the operating room for laparoscopic umbilical hernia repair with mesh.  2. I discussed with the patient the signs and symptoms of incarceration and strangulation and the need to proceed to the ER should they occur.  3. I discussed with the patient the risks and benefits of the procedure to include but not limited to: Infection, bleeding, damage to surrounding structures, possible need for further surgery, possible nerve pain, and possible recurrence. The patient was understanding and wishes to proceed.

## 2018-10-05 NOTE — Pre-Procedure Instructions (Signed)
[id number] [address], [address] AT [facility] [address] [place] [address] Phone: [phone] Fax: [phone]  [facility] [address] [place] [address], [address] [place] [address] Phone: [phone] Fax: [phone]      Your procedure is scheduled on Tuesday September 22nd.  Report to [facility] Main Entrance "A" at 7:15 A.M., and check in at the Admitting office.  Call this number if you have problems the morning of surgery:  [phone]  Call [phone] if you have any questions prior to your surgery date Monday-Friday 8am-4pm    Remember:  Do not eat or drink after midnight the night before your surgery    Take these medicines the morning of surgery with A SIP OF WATER HYDROcodone-acetaminophen (NORCO/VICODIN) if needed  ondansetron [facility]) if needed   7 days prior to surgery STOP taking any Aspirin (unless otherwise instructed by your surgeon), Aleve, Naproxen, Ibuprofen, Motrin, Advil, Goody's, BC's, all herbal medications, fish oil, and all vitamins.    The Morning of Surgery  Do not wear jewelry, make-up or nail polish.  Do not wear lotions, powders, or perfumes/colognes, or deodorant  Do not shave 48 hours prior to surgery.  Men may shave face and neck.  Do not bring valuables to the hospital.  [facility] is not responsible for any belongings or valuables.  If you are a smoker, DO NOT Smoke 24 hours prior to surgery IF you wear a CPAP at night please bring your mask, tubing, and machine the morning of surgery   Remember that you must have someone to transport you home after your surgery, and remain with you for 24 hours if you are discharged the same day.   Contacts, glasses, hearing aids, dentures or bridgework may not be worn into surgery.    Leave your suitcase in the car.  After surgery it may be brought to your room.  For patients admitted to the  hospital, discharge time will be determined by your treatment team.  Patients discharged the day of surgery will not be allowed to drive home.    Special instructions:   - Preparing For Surgery  Before surgery, you can play an important role. Because skin is not sterile, your skin needs to be as free of germs as possible. You can reduce the number of germs on your skin by washing with CHG (chlorahexidine gluconate) Soap before surgery.  CHG is an antiseptic cleaner which kills germs and bonds with the skin to continue killing germs even after washing.    Oral Hygiene is also important to reduce your risk of infection.  Remember - BRUSH YOUR TEETH THE MORNING OF SURGERY WITH YOUR REGULAR TOOTHPASTE  Please do not use if you have an allergy to CHG or antibacterial soaps. If your skin becomes reddened/irritated stop using the CHG.  Do not shave (including legs and underarms) for at least 48 hours prior to first CHG shower. It is OK to shave your face.  Please follow these instructions carefully.   1. Shower the NIGHT BEFORE SURGERY and the MORNING OF SURGERY with CHG Soap.   2. If you chose to wash your hair, wash your hair first as usual with your normal shampoo.  3. After you shampoo, rinse your hair and body thoroughly to remove the shampoo.  4. Use CHG as you would any other liquid soap. You can apply CHG directly to  the skin and wash gently with a scrungie or a clean washcloth.   5. Apply the CHG Soap to your body ONLY FROM THE NECK DOWN.  Do not use on open wounds or open sores. Avoid contact with your eyes, ears, mouth and genitals (private parts). Wash Face and genitals (private parts)  with your normal soap.   6. Wash thoroughly, paying special attention to the area where your surgery will be performed.  7. Thoroughly rinse your body with warm water from the neck down.  8. DO NOT shower/wash with your normal soap after using and rinsing off the CHG Soap.  9. Pat  yourself dry with a CLEAN TOWEL.  10. Wear CLEAN PAJAMAS to bed the night before surgery, wear comfortable clothes the morning of surgery  11. Place CLEAN SHEETS on your bed the night of your first shower and DO NOT SLEEP WITH PETS.    Day of Surgery:  Do not apply any deodorants/lotions. Please shower the morning of surgery with the CHG soap  Please wear clean clothes to the hospital/surgery center.   Remember to brush your teeth WITH YOUR REGULAR TOOTHPASTE.   Please read over the following fact sheets that you were given.

## 2018-10-06 ENCOUNTER — Encounter (HOSPITAL_COMMUNITY)
Admission: RE | Admit: 2018-10-06 | Discharge: 2018-10-06 | Disposition: A | Discharge status: Home / Self Care | Payer: BC Managed Care – PPO | Source: Ambulatory Visit | Attending: General Surgery | Admitting: General Surgery

## 2018-10-06 ENCOUNTER — Other Ambulatory Visit: Payer: Self-pay

## 2018-10-06 ENCOUNTER — Encounter (HOSPITAL_COMMUNITY): Payer: Self-pay

## 2018-10-06 DIAGNOSIS — Z01812 Encounter for preprocedural laboratory examination: Secondary | ICD-10-CM | POA: Insufficient documentation

## 2018-10-06 DIAGNOSIS — K429 Umbilical hernia without obstruction or gangrene: Secondary | ICD-10-CM | POA: Diagnosis not present

## 2018-10-06 HISTORY — DX: Noninfective gastroenteritis and colitis, unspecified: K52.9

## 2018-10-06 HISTORY — DX: Renal tubulo-interstitial disease, unspecified: N15.9

## 2018-10-06 HISTORY — DX: Personal history of urinary calculi: Z87.442

## 2018-10-06 LAB — HEMOGLOBIN: Hemoglobin: 11.9 g/dL — ABNORMAL LOW (ref 12.0–15.0)

## 2018-10-06 NOTE — Progress Notes (Signed)
PCP - [place] Cardiologist - denies  Chest x-ray -  Not needed EKG - not needed  Stress Test - denies ECHO - denies Cardiac Cath - denies   COVID TEST- Friday 9/18   Anesthesia review: NO  Patient denies shortness of breath, fever, cough and chest pain at PAT appointment   Patient verbalized understanding of instructions that were given to them at the PAT appointment. Patient was also instructed that they will need to review over the PAT instructions again at home before surgery.

## 2018-10-08 ENCOUNTER — Inpatient Hospital Stay (HOSPITAL_COMMUNITY): Admission: RE | Admit: 2018-10-08 | Payer: BC Managed Care – PPO | Source: Ambulatory Visit

## 2018-10-12 ENCOUNTER — Ambulatory Visit (HOSPITAL_COMMUNITY): Admission: RE | Admit: 2018-10-12 | Payer: BC Managed Care – PPO | Source: Home / Self Care | Admitting: General Surgery

## 2018-10-12 ENCOUNTER — Encounter (HOSPITAL_COMMUNITY): Admission: RE | Payer: Self-pay | Source: Home / Self Care

## 2018-10-12 SURGERY — REPAIR, HERNIA, UMBILICAL, LAPAROSCOPIC
Anesthesia: General

## 2018-12-15 ENCOUNTER — Ambulatory Visit: Payer: Self-pay

## 2018-12-15 NOTE — Telephone Encounter (Signed)
Patient  Is calling reporting that she has a boil in her  Left ear.  That is very Painful.  Currently applying heat to ear.  Patient states that it painful cant open mouth, very painful.  Does not see a head forming. Patient realizes the office is closed right now wanted advice how to care for it.  Addressed care advice Patient voices understanding.  Will call back Friday to schedule appointment Friday if hasnt improved over Thanksgiving.          Reason for Disposition . Earache persists > 1 hour  Answer Assessment - Initial Assessment Questions 1. LOCATION: "Which ear is involved?"     Left ear 2. SENSATION: "Describe how the ear feels."      Can hear very sore cant open mouth wide open swollen and painful   3. ONSET:  "When did the ear symptoms start?"       yesterday 4. PAIN: "Do you also have an earache?" If so, ask: "How bad is it?" (Scale 1-10; or mild, moderate, severe)     Moderate to sever. 5. CAUSE: "What do you think is causing the ear congestion?"      6. URI: "Do you have a runny nose or cough?"     denies 7. NASAL ALLERGIES: "Are there symptoms of hay fever, such as sneezing or a clear nasal discharge?"     denies 8. PREGNANCY: "Is there any chance you are pregnant?" "When was your last menstrual period?"    denies  Protocols used: EAR - CONGESTION-A-AH

## 2018-12-17 ENCOUNTER — Ambulatory Visit: Payer: Self-pay | Admitting: *Deleted

## 2018-12-17 NOTE — Telephone Encounter (Signed)
  Mother, Amy Lambert and Amy Lambert on the phone together.   Amy Lambert is c/o the left side of her face, jaw and outer ear being swollen and very painful.   "It feels like there is a boil under my skin ear my left ear".  See triage notes.  Since the office is closed for the Thanksgiving holiday I instructed her to go to the local urgent care center.   "There is one close to here".    Both pt and mother agreeable to this plan. Reason for Disposition . [1] Swelling is red AND [2] very painful to touch  Answer Assessment - Initial Assessment Questions 1. ONSET: "When did the swelling start?" (e.g., minutes, hours, days)     Mom, Amy Lambert calling in.   She has swelling on the side of her face and in her ear.   Her ear is painful.   It's the left side.   Started Monday or Tuesday.   By Amy Lambert. Afternoon it looked like a big bump.   But now it's so swollen you can't see into the canal.    2. LOCATION: "What part of the face is swollen?"     The swelling goes down below her ear and into the jaw. 3. SEVERITY: "How swollen is it?"     See above 4. ITCHING: "Is there any itching?" If so, ask: "How much?"   (Scale 1-10; mild, moderate or severe)     It was itching bad last night down in my ear.   A little drainage this morning with a little blood in it.   5. PAIN: "Is the swelling painful to touch?" If so, ask: "How painful is it?"   (Scale 1-10; mild, moderate or severe)     Yes.   7-8 6. FEVER: "Do you have a fever?" If so, ask: "What is it, how was it measured, and when did it start?"      No fever   Taking Tylenol for the pain.   7. CAUSE: "What do you think is causing the face swelling?"     Not sure.   It's like a boil under the skin. 8. RECURRENT SYMPTOM: "Have you had face swelling before?" If so, ask: "When was the last time?" "What happened that time?"     No 9. OTHER SYMPTOMS: "Do you have any other symptoms?" (e.g., toothache, leg swelling)     It feels funny when I chew and swallow. 10. PREGNANCY: "Is  there any chance you are pregnant?" "When was your last menstrual period?"       No  Protocols used: [facility]

## 2018-12-20 NOTE — Telephone Encounter (Signed)
LVm for the pt to call back, checking up on the pt if she need to do virtual visit with [facility].

## 2019-01-28 ENCOUNTER — Ambulatory Visit: Payer: BC Managed Care – PPO | Attending: Internal Medicine

## 2019-01-28 DIAGNOSIS — Z20822 Contact with and (suspected) exposure to covid-19: Secondary | ICD-10-CM

## 2019-01-29 LAB — NOVEL CORONAVIRUS, NAA: SARS-CoV-2, NAA: NOT DETECTED

## 2019-02-04 ENCOUNTER — Telehealth (INDEPENDENT_AMBULATORY_CARE_PROVIDER_SITE_OTHER): Payer: BC Managed Care – PPO | Admitting: Nurse Practitioner

## 2019-02-04 ENCOUNTER — Other Ambulatory Visit: Payer: Self-pay

## 2019-02-04 ENCOUNTER — Encounter: Payer: Self-pay | Admitting: Nurse Practitioner

## 2019-02-04 VITALS — Ht 60.0 in | Wt 175.0 lb

## 2019-02-04 DIAGNOSIS — R197 Diarrhea, unspecified: Secondary | ICD-10-CM | POA: Diagnosis not present

## 2019-02-04 DIAGNOSIS — R1013 Epigastric pain: Secondary | ICD-10-CM

## 2019-02-04 DIAGNOSIS — L732 Hidradenitis suppurativa: Secondary | ICD-10-CM | POA: Diagnosis not present

## 2019-02-04 NOTE — Patient Instructions (Addendum)
Go to lab for blood draw and stool kit collection. You will be contacted to schedule appt with GI and dermatology  Maintain low fat/high fiber diet, adequate oral hydration, and avoid ETOH and caffeine.   Indigestion Indigestion is a feeling of pain, discomfort, burning, or fullness in the upper part of your belly (abdomen). It can come and go. It may occur often or rarely. Indigestion tends to happen while you are eating or right after you have finished eating. Indigestion may be a symptom of another condition. It may be worse:  At night.  When bending over.  While lying down. Follow these instructions at home: Eating and drinking   Follow an eating plan as told by your doctor.  You may need to avoid foods and drinks such as: ? Chocolate and cocoa. ? Peppermint and mint flavorings. ? Garlic and onions. ? Horseradish. ? Spicy and acidic foods, such as:  Peppers.  Chili powder and curry powder.  Vinegar.  Hot sauces and BBQ sauce. ? Citrus fruits, such as:  Oranges.  Lemons.  Limes. ? Tomato-based foods, such as:  Red sauce and pizza with red sauce.  Chili.  Salsa. ? Fried and fatty foods, such as:  Donuts.  [place] fries and potato chips.  High-fat dressings. ? High-fat meats, such as:  Hot dogs and sausage.  Rib eye steak.  Ham and bacon. ? High-fat dairy items, such as:  Whole milk.  Butter.  Cream cheese. ? Coffee and tea (with or without caffeine). ? Drinks that contain alcohol. ? Energy drinks and sports drinks. ? Carbonated drinks or sodas. ? Citrus fruit juices.  Eat small meals often. Avoid eating large meals.  Avoid drinking large amounts of liquid with your meals.  Avoid eating meals during the 2-3 hours before bedtime.  Avoid lying down right after you eat.  Avoid exercise for 2 hours after you eat. Lifestyle      Maintain a healthy weight. Ask your doctor what weight is healthy for you. If you need to lose weight,  work with your doctor.  Exercise for at least 30 minutes on 5 or more days each week, or as told by your doctor. ? Avoid exercises that include bending forward. This can make your symptoms worse.  Wear loose clothes. Do not wear anything tight around your waist.  Do not use any products that contain nicotine or tobacco, including cigarettes, e-cigarettes, and chewing tobacco. These can make your symptoms worse. If you need help quitting, ask your doctor.  Raise (elevate) the head of your bed about 6 inches (15 cm) when you sleep.  Try to lower your stress. If you need help doing this, ask your doctor. General instructions  Take over-the-counter and prescription medicines only as told by your doctor. ? Do not take aspirin, ibuprofen, or other NSAIDs unless your doctor says it is okay.  Pay attention to any changes in your symptoms.  Keep all follow-up visits as told by your doctor. This is important. Contact a doctor if:  You have new symptoms.  You lose weight and you do not know why it is happening.  You have trouble swallowing, or it hurts to swallow.  Your symptoms do not get better with treatment.  Your symptoms last for more than 2 days.  You have a fever.  You throw up (vomit). Get help right away if:  You have pain in your arms, neck, jaw, teeth, or back.  You feel sweaty, dizzy, or light-headed.  You  pass out (faint).  You have chest pain or shortness of breath.  You cannot stop throwing up, or you throw up blood.  Your poop (stool) is bloody or black.  You have very bad pain in your belly. These symptoms may represent a serious problem that is an emergency. Do not wait to see if the symptoms will go away. Get medical help right away. Call your local emergency services (911 in the [place] Do not drive yourself to the hospital. Summary  Indigestion is a feeling of pain, discomfort, burning, or fullness in the upper part of your belly. It tends to happen  while you are eating or right after you have finished eating.  Follow an eating plan and other lifestyle changes as told by your doctor.  Take over-the-counter and prescription medicines only as told by your doctor. Do not take aspirin, ibuprofen, or other NSAIDs unless your doctor says it is okay.  Contact your doctor if your symptoms do not get better or they get worse.  Some symptoms may represent a serious problem that is an emergency. Do not wait to see if the symptoms will go away. Get medical help right away. This information is not intended to replace advice given to you by your health care provider. Make sure you discuss any questions you have with your health care provider. Document Revised: 06/08/2017 Document Reviewed: 06/08/2017 Elsevier Patient Education  [place].

## 2019-02-04 NOTE — Progress Notes (Signed)
Virtual Visit via Video Note  I connected with@ on 02/04/19 at  1:00 PM EST by a video enabled telemedicine application and verified that I am speaking with the correct person using two identifiers.  Location: Patient:Home Provider: Office Participants: patient and provider  I discussed the limitations of evaluation and management by telemedicine and the availability of in person appointments. I also discussed with the patient that there may be a patient responsible charge related to this service. The patient expressed understanding and agreed to proceed.  CC:pt is c/o of abd pain,constipation and then diarrhea,nausea,no appelitie/pt went to the hospital in 10/2018 dx diverticulitis.had hernia repare in 11/01/2018. Food allergy consult?  History of Present Illness: Abdominal Pain This is a chronic problem. The current episode started more than 1 year ago (since college). The onset quality is gradual. The problem occurs constantly. The problem has been waxing and waning. The pain is located in the generalized abdominal region. The quality of the pain is colicky, dull, a sensation of fullness and cramping. The abdominal pain does not radiate. Associated symptoms include anorexia, constipation, diarrhea, nausea and weight loss. Pertinent negatives include no belching, dysuria, fever, flatus, frequency, headaches, hematochezia, hematuria, melena, myalgias or vomiting. Exacerbated by: unknown. The pain is relieved by nothing. She has tried antibiotics, antacids and acetaminophen for the symptoms. The treatment provided mild relief. Prior diagnostic workup includes CT scan and surgery. Her past medical history is significant for abdominal surgery. There is no history of Crohn's disease, gallstones, GERD, irritable bowel syndrome, pancreatitis, PUD or ulcerative colitis.  umbilical hernia repair 10/2018 Treated for colitis 09/2018 with azithromycin Presence of mucus in stool. No FHx of UC or IBD. Has  changed diet to low fat/high fiber/low sugar in last 48months with no improvement. Wt Readings from Last 3 Encounters:  02/04/19 175 lb (79.4 kg)  10/06/18 183 lb 9.6 oz (83.3 kg)  09/24/18 184 lb (83.5 kg)   She will like referral to another dermatology due to recurrent axilla abscess and scarring.  Observations/Objective: Physical Exam  Constitutional: She is oriented to person, place, and time. No distress.  Pulmonary/Chest: Effort normal.  Neurological: She is alert and oriented to person, place, and time.  Skin: Skin is dry.  Psychiatric: She has a normal mood and affect. Her behavior is normal. Thought content normal.    Assessment and Plan: Amy Lambert was seen today for abdominal pain.  Diagnoses and all orders for this visit:  Dyspepsia -     Stool, WBC/Lactoferrin; Future -     TSH; Future -     Hepatic function panel; Future -     CBC w/Diff; Future -     C-reactive protein; Future -     Ambulatory referral to Gastroenterology -     H. pylori stool (quest); Future -     Stool Culture; Future  Diarrhea, unspecified type -     Stool, WBC/Lactoferrin; Future -     TSH; Future -     Hepatic function panel; Future -     CBC w/Diff; Future -     C-reactive protein; Future -     Ambulatory referral to Gastroenterology -     H. pylori stool (quest); Future -     Stool Culture; Future  Hidradenitis axillaris -     Ambulatory referral to Dermatology   Follow Up Instructions: See avs   I discussed the assessment and treatment plan with the patient. The patient was provided an opportunity to ask questions and  all were answered. The patient agreed with the plan and demonstrated an understanding of the instructions.   The patient was advised to call back or seek an in-person evaluation if the symptoms worsen or if the condition fails to improve as anticipated.   Wilfred Lacy, NP

## 2019-02-09 ENCOUNTER — Other Ambulatory Visit (INDEPENDENT_AMBULATORY_CARE_PROVIDER_SITE_OTHER): Payer: BC Managed Care – PPO

## 2019-02-09 ENCOUNTER — Other Ambulatory Visit: Payer: Self-pay

## 2019-02-09 ENCOUNTER — Encounter: Payer: Self-pay | Admitting: Gastroenterology

## 2019-02-09 DIAGNOSIS — R1013 Epigastric pain: Secondary | ICD-10-CM | POA: Diagnosis not present

## 2019-02-09 DIAGNOSIS — R197 Diarrhea, unspecified: Secondary | ICD-10-CM

## 2019-02-09 LAB — HEPATIC FUNCTION PANEL
ALT: 9 U/L (ref 0–35)
AST: 11 U/L (ref 0–37)
Albumin: 4 g/dL (ref 3.5–5.2)
Alkaline Phosphatase: 63 U/L (ref 39–117)
Bilirubin, Direct: 0 mg/dL (ref 0.0–0.3)
Total Bilirubin: 0.2 mg/dL (ref 0.2–1.2)
Total Protein: 7.6 g/dL (ref 6.0–8.3)

## 2019-02-09 LAB — CBC WITH DIFFERENTIAL/PLATELET
Basophils Absolute: 0.1 10*3/uL (ref 0.0–0.1)
Basophils Relative: 0.8 % (ref 0.0–3.0)
Eosinophils Absolute: 0.2 10*3/uL (ref 0.0–0.7)
Eosinophils Relative: 3.7 % (ref 0.0–5.0)
HCT: 34.5 % — ABNORMAL LOW (ref 36.0–46.0)
Hemoglobin: 11.2 g/dL — ABNORMAL LOW (ref 12.0–15.0)
Lymphocytes Relative: 36.7 % (ref 12.0–46.0)
Lymphs Abs: 2.4 10*3/uL (ref 0.7–4.0)
MCHC: 32.5 g/dL (ref 30.0–36.0)
MCV: 86.1 fl (ref 78.0–100.0)
Monocytes Absolute: 0.3 10*3/uL (ref 0.1–1.0)
Monocytes Relative: 4.3 % (ref 3.0–12.0)
Neutro Abs: 3.6 10*3/uL (ref 1.4–7.7)
Neutrophils Relative %: 54.5 % (ref 43.0–77.0)
Platelets: 358 10*3/uL (ref 150.0–400.0)
RBC: 4.01 Mil/uL (ref 3.87–5.11)
RDW: 15.2 % (ref 11.5–15.5)
WBC: 6.6 10*3/uL (ref 4.0–10.5)

## 2019-02-09 LAB — TSH: TSH: 1.57 u[IU]/mL (ref 0.35–4.50)

## 2019-02-09 LAB — C-REACTIVE PROTEIN: CRP: <1 mg/dL (ref 0.5–20.0)

## 2019-02-09 NOTE — Addendum Note (Signed)
Addended by: Varney Biles on: 02/09/2019 09:52 AM   Modules accepted: Orders

## 2019-02-09 NOTE — Addendum Note (Signed)
Addended by: Varney Biles on: 02/09/2019 03:07 PM   Modules accepted: Orders

## 2019-02-13 LAB — HELICOBACTER PYLORI  SPECIAL ANTIGEN
MICRO NUMBER:: 10061703
SPECIMEN QUALITY: ADEQUATE

## 2019-02-13 LAB — STOOL CULTURE
MICRO NUMBER:: 10061701
MICRO NUMBER:: 10061702
MICRO NUMBER:: 10061705
SHIGA RESULT:: NOT DETECTED
SPECIMEN QUALITY:: ADEQUATE
SPECIMEN QUALITY:: ADEQUATE
SPECIMEN QUALITY:: ADEQUATE

## 2019-02-13 LAB — FECAL LACTOFERRIN, QUANT
Fecal Lactoferrin: NEGATIVE
MICRO NUMBER:: 10061704
SPECIMEN QUALITY:: ADEQUATE

## 2019-02-14 ENCOUNTER — Ambulatory Visit: Payer: BC Managed Care – PPO | Admitting: Gastroenterology

## 2019-02-14 IMAGING — CT CT RENAL STONE PROTOCOL
2 of 4 series · 17 of 46 positions shown, 19 images · non-contrast
Comparison: CT 11/26/2014

CLINICAL DATA: Left flank pain for 3 hours

EXAM:
CT ABDOMEN AND PELVIS WITHOUT CONTRAST
TECHNIQUE: Multidetector CT imaging of the abdomen and pelvis was performed
following the standard protocol without IV contrast.

[Series 2: axial st · axial · 0.79mm/px · z∈[-410,-5]mm · 14 of 89 slices shown, 16 images]
[im 4/89  soft-tissue]
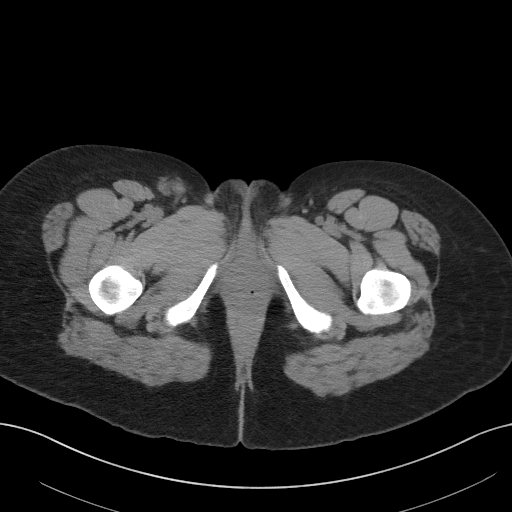
[im 4/89  bone]
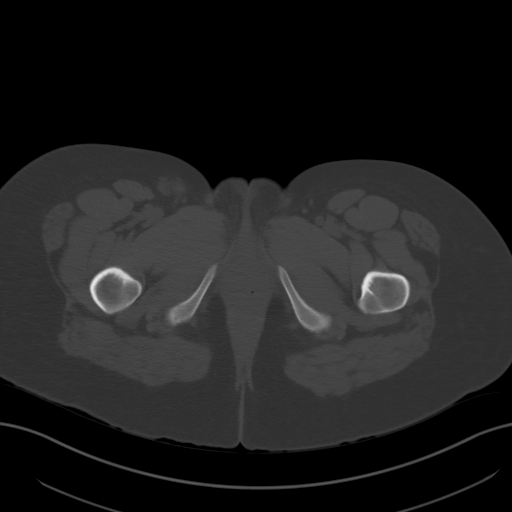
[im 12/89  soft-tissue]
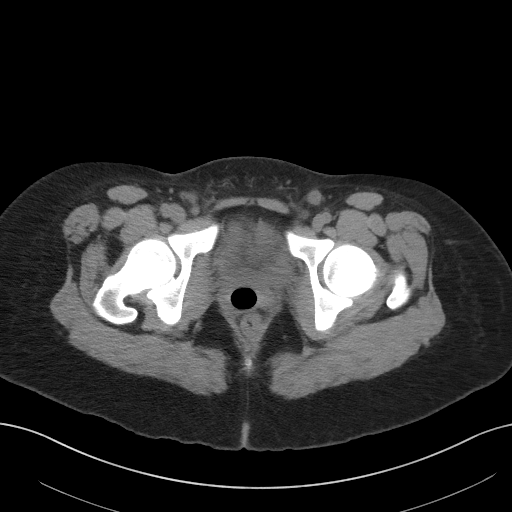
[im 19/89  soft-tissue]
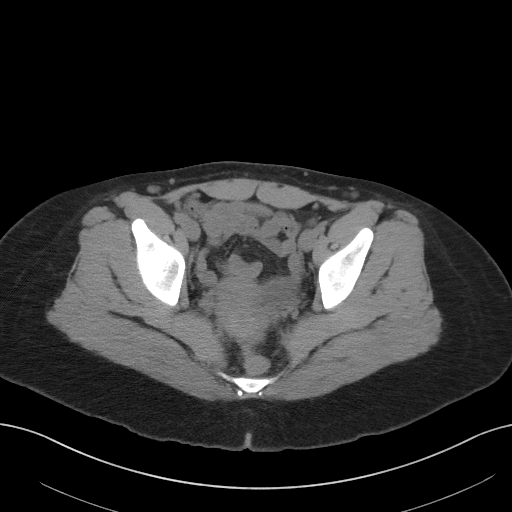
[im 23/89  soft-tissue]
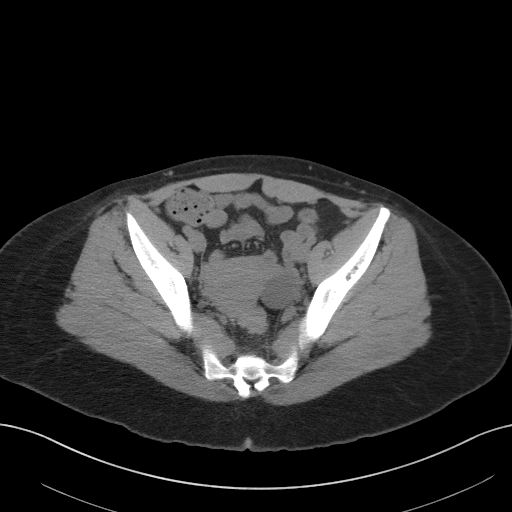
[im 30/89  soft-tissue]
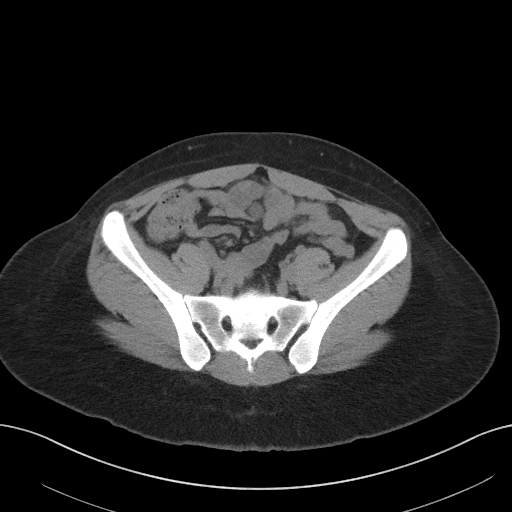
[im 37/89  soft-tissue]
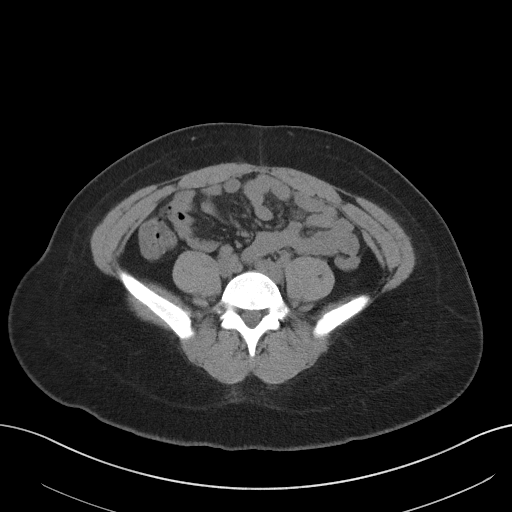
[im 41/89  soft-tissue]
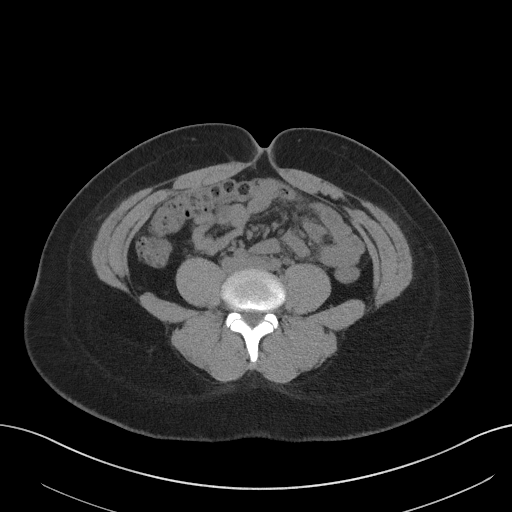
[im 48/89  soft-tissue]
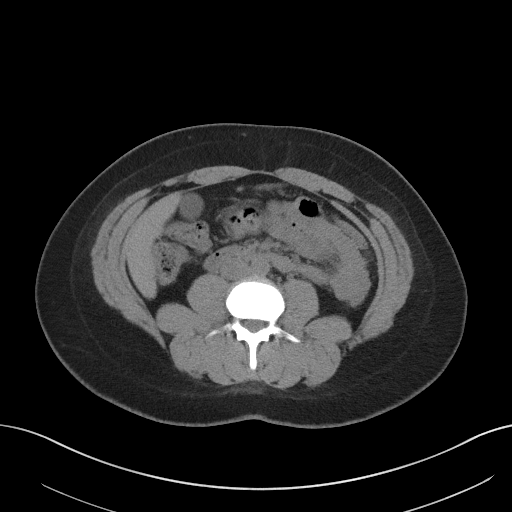
[im 52/89  soft-tissue]
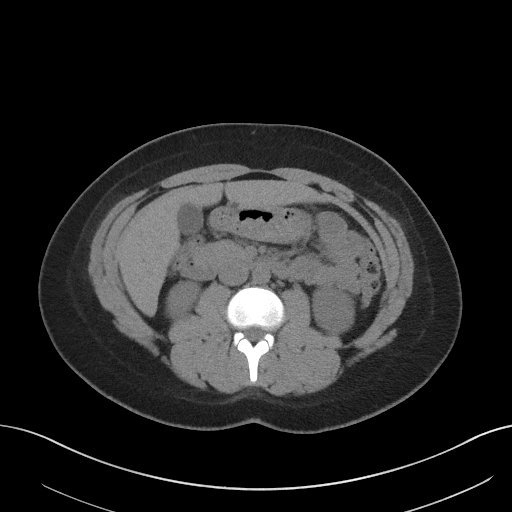
[im 52/89  bone]
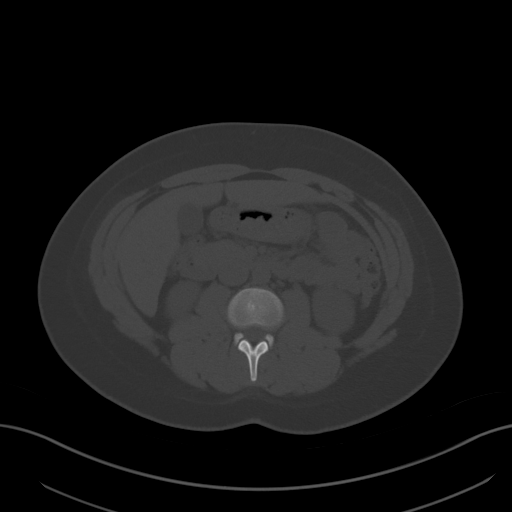
[im 59/89  soft-tissue]
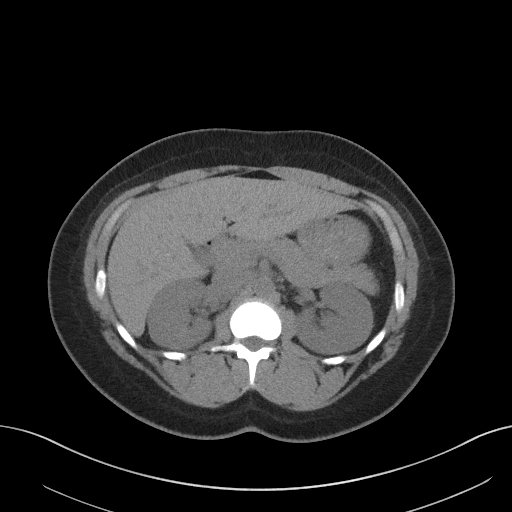
[im 67/89  soft-tissue]
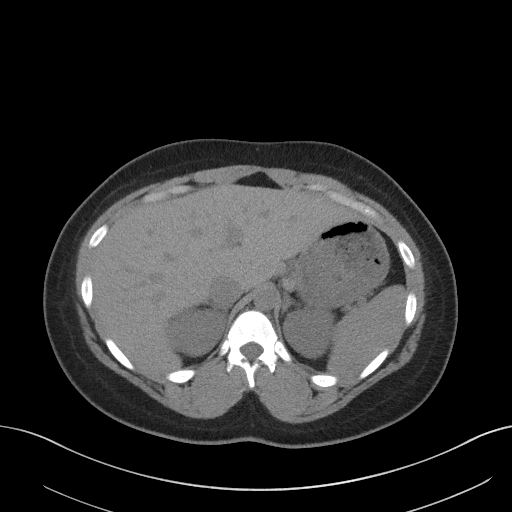
[im 70/89  soft-tissue]
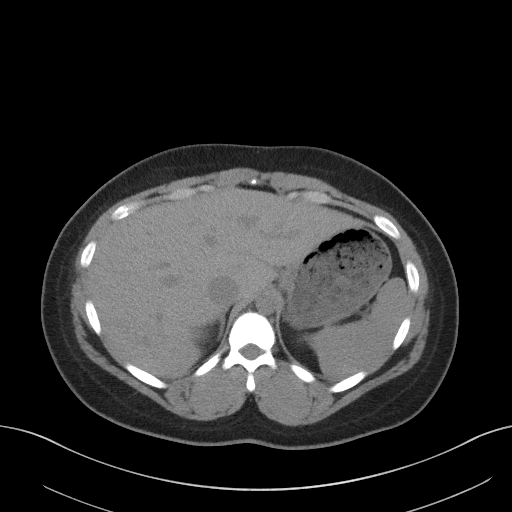
[im 78/89  soft-tissue]
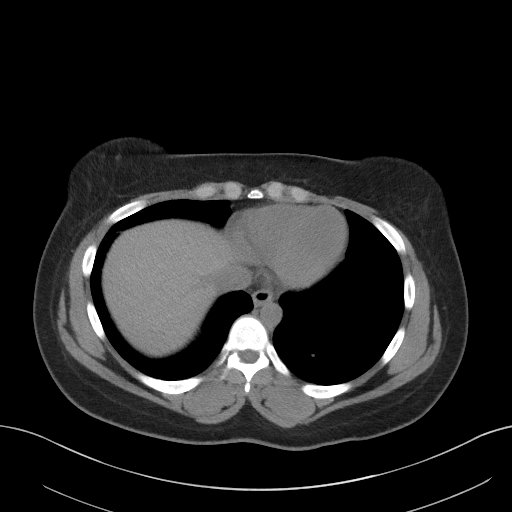
[im 85/89  soft-tissue]
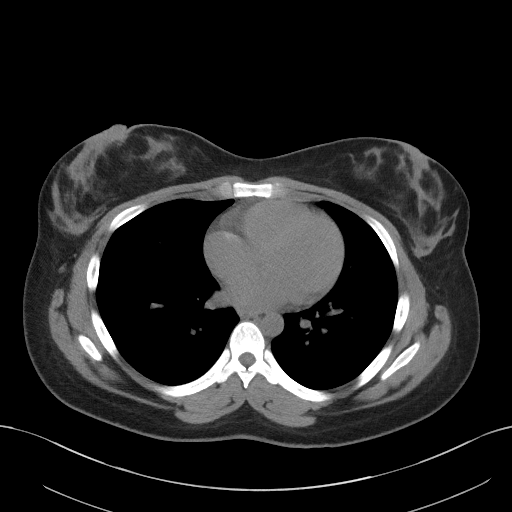

[Series 4: coronal st · coronal · 0.79mm/px · 3 of 70 slices shown]
[im 24/70  soft-tissue]
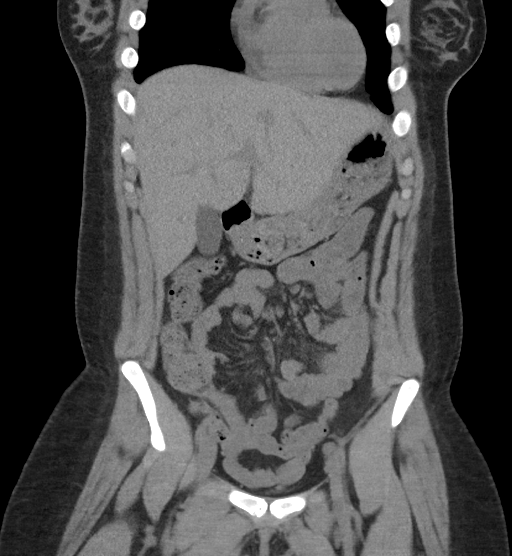
[im 31/70  soft-tissue]
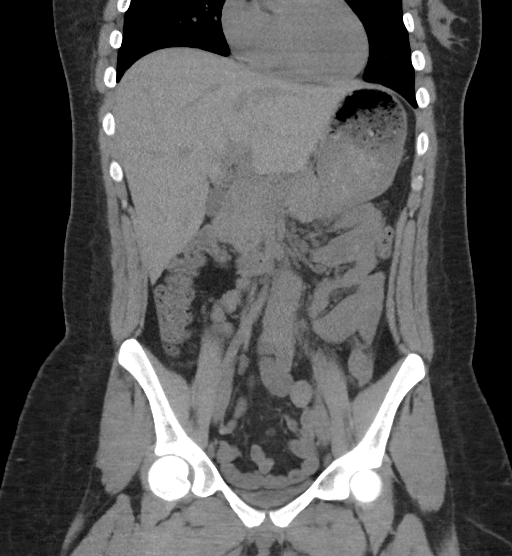
[im 39/70  soft-tissue]
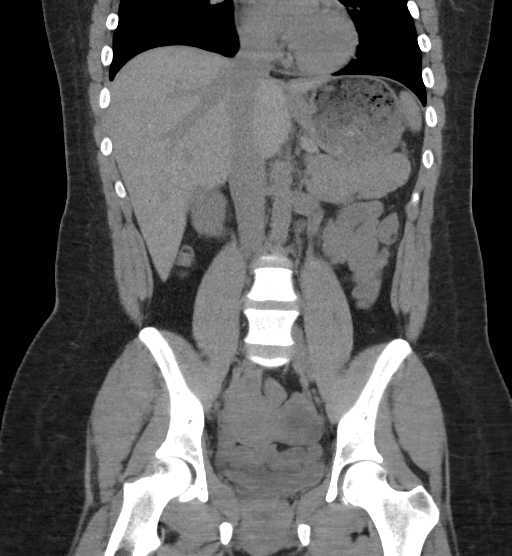

[17 of 46 positions shown; findings below may reference images not displayed]

FINDINGS: Lower chest: No acute abnormality.

Hepatobiliary: No focal liver abnormality is seen. No gallstones,
gallbladder wall thickening, or biliary dilatation.

Pancreas: Unremarkable. No pancreatic ductal dilatation or
surrounding inflammatory changes.

Spleen: Normal in size without focal abnormality.

Adrenals/Urinary Tract: Adrenal glands are within normal limits.
Increased density at the right renal pyramids. Multiple punctate
intrarenal stones bilaterally. Minimal left hydronephrosis and
prominence of left ureter, secondary to a 3 mm stone in the distal
left ureter, just above the left UVJ. The bladder is normal.

Stomach/Bowel: Stomach is within normal limits. Appendix appears
normal. No evidence of bowel wall thickening, distention, or
inflammatory changes.

Vascular/Lymphatic: No significant vascular findings are present. No
enlarged abdominal or pelvic lymph nodes.

Reproductive: Uterus unremarkable.  3.6 cm cyst in the left ovary.

Other: No abdominal wall hernia or abnormality. No abdominopelvic
ascites.

Musculoskeletal: No acute or significant osseous findings.
IMPRESSION: 1. Minimal left hydronephrosis and hydroureter secondary to a 3 mm
stone in the distal left ureter, just above the left UVJ
2. Multiple intrarenal stones bilaterally. Appearance of mild
nephrocalcinosis in the right greater than left kidney

## 2019-07-15 ENCOUNTER — Other Ambulatory Visit: Payer: Self-pay

## 2019-07-15 ENCOUNTER — Telehealth (INDEPENDENT_AMBULATORY_CARE_PROVIDER_SITE_OTHER): Payer: BC Managed Care – PPO | Admitting: Nurse Practitioner

## 2019-07-15 ENCOUNTER — Encounter: Payer: Self-pay | Admitting: Nurse Practitioner

## 2019-07-15 VITALS — Temp 97.8°F | Ht 60.0 in | Wt 178.0 lb

## 2019-07-15 DIAGNOSIS — J0391 Acute recurrent tonsillitis, unspecified: Secondary | ICD-10-CM | POA: Diagnosis not present

## 2019-07-15 DIAGNOSIS — Z8719 Personal history of other diseases of the digestive system: Secondary | ICD-10-CM | POA: Diagnosis not present

## 2019-07-15 DIAGNOSIS — Z9889 Other specified postprocedural states: Secondary | ICD-10-CM

## 2019-07-15 NOTE — Progress Notes (Signed)
Virtual Visit via Video Note  I connected with@ on 07/15/19 at  9:00 AM EDT by a video enabled telemedicine application and verified that I am speaking with the correct person using two identifiers.  Location: Patient:Home Provider: Office Participants: patient and provider  I discussed the limitations of evaluation and management by telemedicine and the availability of in person appointments. I also discussed with the patient that there may be a patient responsible charge related to this service. The patient expressed understanding and agreed to proceed.  CC:Pt feels surgery for her hernia didn't work, feels it again under skin like a button//when she bends over feels like hittin a bruise. pt stated her tonsils have been swollen for almost a week, she went to urgent care Sunday an tested for strep and covid and was all neg called her wed an said it was upper resp infection prescribed clindamycin and steroid pt hasn't took meds she feels its tonsiliitis and wants to check with you-coughin up clear to yellow mucus.  History of Present Illness: Sore Throat  This is a recurrent problem. The current episode started more than 1 year ago. The problem has been waxing and waning. There has been no fever. Associated symptoms include congestion. Pertinent negatives include no coughing, drooling, ear discharge, ear pain, hoarse voice, plugged ear sensation, stridor, swollen glands or trouble swallowing. She has had no exposure to strep or mono. She has tried gargles and NSAIDs for the symptoms. The treatment provided moderate relief.  started clindamycin and oral prednisone, but concerned about possible side effects and if it was the appropriate treatment. She reports some improvement with current medications. Reports snoring despite weight loss Has 2-3 incidents of pharyngitis per year.  She has umbilical hernia repair 10/2018. She is experiencing umbilical tenderness for several days, no drainage, no  change in GI/GU function, no fever.   Observations/Objective: Physical Exam HENT:     Head:     Jaw: There is normal jaw occlusion.     Salivary Glands: Right salivary gland is not diffusely enlarged or tender. Left salivary gland is not diffusely enlarged or tender.     Mouth/Throat:     Mouth: Mucous membranes are moist.     Dentition: Normal dentition.     Pharynx: Posterior oropharyngeal erythema present. No oropharyngeal exudate or uvula swelling.     Tonsils: No tonsillar exudate or tonsillar abscesses. 2+ on the right. 2+ on the left.  Abdominal:     Comments: Inverted umbilicus, no erythema, no drainage.  Skin:    Findings: No rash.  Neurological:     Mental Status: She is alert and oriented to person, place, and time.    Assessment and Plan: Amy Lambert was seen today for sore throat and hernia.  Diagnoses and all orders for this visit:  Recurrent tonsillitis -     Ambulatory referral to ENT  S/P hernia repair   Follow Up Instructions: Complete oral abx and oral prednisone as prescribed Schedule appt with surgeon to eval umbilical tenderness. You will be contacted to schedule appt with ENT. Maintain adequate oral hygiene   I discussed the assessment and treatment plan with the patient. The patient was provided an opportunity to ask questions and all were answered. The patient agreed with the plan and demonstrated an understanding of the instructions.   The patient was advised to call back or seek an in-person evaluation if the symptoms worsen or if the condition fails to improve as anticipated.   Amy Penna, NP

## 2019-08-05 ENCOUNTER — Other Ambulatory Visit (HOSPITAL_COMMUNITY): Payer: Self-pay | Admitting: General Surgery

## 2019-08-05 ENCOUNTER — Encounter (HOSPITAL_COMMUNITY): Payer: Self-pay

## 2019-08-05 ENCOUNTER — Other Ambulatory Visit: Payer: Self-pay

## 2019-08-05 ENCOUNTER — Other Ambulatory Visit: Payer: Self-pay | Admitting: General Surgery

## 2019-08-05 ENCOUNTER — Ambulatory Visit (HOSPITAL_COMMUNITY)
Admission: RE | Admit: 2019-08-05 | Discharge: 2019-08-05 | Disposition: A | Discharge status: Home / Self Care | Payer: BC Managed Care – PPO | Source: Ambulatory Visit | Attending: General Surgery | Admitting: General Surgery

## 2019-08-05 DIAGNOSIS — K429 Umbilical hernia without obstruction or gangrene: Secondary | ICD-10-CM

## 2019-09-07 ENCOUNTER — Other Ambulatory Visit: Payer: Self-pay | Admitting: General Surgery

## 2019-09-07 DIAGNOSIS — R1033 Periumbilical pain: Secondary | ICD-10-CM

## 2019-09-07 DIAGNOSIS — K429 Umbilical hernia without obstruction or gangrene: Secondary | ICD-10-CM

## 2019-09-13 ENCOUNTER — Ambulatory Visit
Admission: RE | Admit: 2019-09-13 | Discharge: 2019-09-13 | Disposition: A | Discharge status: Home / Self Care | Payer: BC Managed Care – PPO | Source: Ambulatory Visit | Attending: General Surgery | Admitting: General Surgery

## 2019-09-13 DIAGNOSIS — K429 Umbilical hernia without obstruction or gangrene: Secondary | ICD-10-CM

## 2020-02-03 ENCOUNTER — Telehealth: Payer: Self-pay | Admitting: Nurse Practitioner

## 2020-02-03 NOTE — Telephone Encounter (Signed)
Pt called in regarding hernia pain. She said she spoke to the triage nurse this morning before the office opened and they said they would send a message to the office.  Pt scheduled for Tuesday 1/18 with Dr. Salena Saner as Amy Lambert only has same day slots. Pt asking how to get thru the weekend/recommendations to help ease pain. Please call at [phone].

## 2020-02-07 ENCOUNTER — Encounter: Payer: Self-pay | Admitting: Nurse Practitioner

## 2020-02-07 ENCOUNTER — Other Ambulatory Visit: Payer: Self-pay

## 2020-02-07 ENCOUNTER — Ambulatory Visit: Payer: BC Managed Care – PPO | Admitting: Family Medicine

## 2020-02-07 ENCOUNTER — Ambulatory Visit (INDEPENDENT_AMBULATORY_CARE_PROVIDER_SITE_OTHER): Payer: BLUE CROSS/BLUE SHIELD | Admitting: Nurse Practitioner

## 2020-02-07 VITALS — BP 110/82 | HR 92 | Temp 98.2°F | Ht 61.0 in | Wt 188.6 lb

## 2020-02-07 DIAGNOSIS — Z8719 Personal history of other diseases of the digestive system: Secondary | ICD-10-CM

## 2020-02-07 DIAGNOSIS — K5900 Constipation, unspecified: Secondary | ICD-10-CM

## 2020-02-07 DIAGNOSIS — R1905 Periumbilic swelling, mass or lump: Secondary | ICD-10-CM

## 2020-02-07 DIAGNOSIS — Z9889 Other specified postprocedural states: Secondary | ICD-10-CM

## 2020-02-07 MED ORDER — SENNOSIDES-DOCUSATE SODIUM 8.6-50 MG PO TABS: 1.0000 | ORAL_TABLET | Freq: Two times a day (BID) | ORAL | 5 refills | Status: DC

## 2020-02-07 NOTE — Progress Notes (Signed)
Subjective:  Patient ID: Amy Lambert, female    DOB: 03-Dec-1995  Age: 25 y.o. MRN: [id number]  CC: Acute Visit (Pt c/o hernia pain x2 weeks. )  HPI Ms. Soucy presents with persistent and worsening pain and nodule at umbilical region since S/p umbilical hernia by Dr. Derrell Lolling 10/2018. Worsening pain with Abdominal exercise, or straining during BM. She wasevaluated by Dr. Derrell Lolling 08/2019. She states no recommended was provided to explain reason doe mass and tenderness.  ABD [place] completed 08/2019:Atypical appearance of a 12 x 13 x 11 mm diameter nodular focus at the umbilicus, demonstrating peripheral blood flow on color Doppler Imaging. The lesion does extend towards the umbilicus but does not demonstrate definite motion with Valsalva. This may represent a small fat containing umbilical hernia though it is difficult to completely exclude a periumbilical nodule such as an enlarged abnormal lymph node. On prior CT exam from 2020, patient does have a tiny umbilical hernia which contains fat and nonobstructed small bowel at that time; no bowel seen at present.  Reviewed past Medical, Social and Family history today.  Outpatient Medications Prior to Visit  Medication Sig Dispense Refill  . levonorgestrel (KYLEENA) 19.5 MG IUD     . clindamycin (CLEOCIN) 300 MG capsule Take 300 mg by mouth 3 (three) times daily. (Patient not taking: Reported on 02/07/2020)    . etonogestrel (NEXPLANON) 68 MG IMPL implant 1 each by Subdermal route once. (Patient not taking: Reported on 02/07/2020)    . methylPREDNISolone (MEDROL DOSEPAK) 4 MG TBPK tablet Take by mouth as directed. (Patient not taking: Reported on 02/07/2020)    . ondansetron (ZOFRAN) 4 MG tablet Take 1 tablet (4 mg total) by mouth every 6 (six) hours. (Patient not taking: Reported on 02/07/2020) 12 tablet 0   No facility-administered medications prior to visit.    ROS See HPI  Objective:  BP 110/82 (BP Location: Left Arm, Patient Position: Sitting,  Cuff Size: Normal)   Pulse 92   Temp 98.2 F (36.8 C) (Temporal)   Ht 5\' 1"  (1.549 m)   Wt 188 lb 9.6 oz (85.5 kg)   SpO2 99%   BMI 35.64 kg/m   Physical Exam Vitals reviewed.  Constitutional:      Appearance: She is obese.  Pulmonary:     Effort: Pulmonary effort is normal.  Abdominal:     General: There is no distension.     Palpations: Abdomen is soft. There is mass.     Tenderness: There is guarding.    Neurological:     Mental Status: She is alert.     Assessment & Plan:  This visit occurred during the SARS-CoV-2 public health emergency.  Safety protocols were in place, including screening questions prior to the visit, additional usage of staff PPE, and extensive cleaning of exam room while observing appropriate contact time as indicated for disinfecting solutions.   Nieve was seen today for acute visit.  Diagnoses and all orders for this visit:  Periumbilic swelling, mass or lump -     Ambulatory referral to General Surgery  Hx of umbilical hernia repair -     Ambulatory referral to General Surgery  Constipation, unspecified constipation type -     senna-docusate (SENOKOT-S) 8.6-50 MG tablet; Take 1 tablet by mouth 2 (two) times daily.    Problem List Items Addressed This Visit      Other   Umbilical hernia, incarcerated    Other Visit Diagnoses    Periumbilic swelling, mass or lump    -  Primary   Relevant Orders   Ambulatory referral to General Surgery   Constipation, unspecified constipation type       Relevant Medications   senna-docusate (SENOKOT-S) 8.6-50 MG tablet      Follow-up: Return in about 4 weeks (around 03/06/2020) for CPE (fasting).  Alysia Penna, NP

## 2020-02-07 NOTE — Patient Instructions (Addendum)
Sign medical release to get records from Dr. Derrell Lolling office.  You will be contacted to schedule an appt with [facility].  Call office if no improvement in constipation in 3days.  Constipation, Adult Constipation is when a person has fewer than three bowel movements in a week, has difficulty having a bowel movement, or has stools (feces) that are dry, hard, or larger than normal. Constipation may be caused by an underlying condition. It may become worse with age if a person takes certain medicines and does not take in enough fluids. Follow these instructions at home: Eating and drinking  Eat foods that have a lot of fiber, such as beans, whole grains, and fresh fruits and vegetables.  Limit foods that are low in fiber and high in fat and processed sugars, such as fried or sweet foods. These include french fries, hamburgers, cookies, candies, and soda.  Drink enough fluid to keep your urine pale yellow.   General instructions  Exercise regularly or as told by your health care provider. Try to do 150 minutes of moderate exercise each week.  Use the bathroom when you have the urge to go. Do not hold it in.  Take over-the-counter and prescription medicines only as told by your health care provider. This includes any fiber supplements.  During bowel movements: ? Practice deep breathing while relaxing the lower abdomen. ? Practice pelvic floor relaxation.  Watch your condition for any changes. Let your health care provider know about them.  Keep all follow-up visits as told by your health care provider. This is important. Contact a health care provider if:  You have pain that gets worse.  You have a fever.  You do not have a bowel movement after 4 days.  You vomit.  You are not hungry or you lose weight.  You are bleeding from the opening between the buttocks (anus).  You have thin, pencil-like stools. Get help right away if:  You have a fever and your  symptoms suddenly get worse.  You leak stool or have blood in your stool.  Your abdomen is bloated.  You have severe pain in your abdomen.  You feel dizzy or you faint. Summary  Constipation is when a person has fewer than three bowel movements in a week, has difficulty having a bowel movement, or has stools (feces) that are dry, hard, or larger than normal.  Eat foods that have a lot of fiber, such as beans, whole grains, and fresh fruits and vegetables.  Drink enough fluid to keep your urine pale yellow.  Take over-the-counter and prescription medicines only as told by your health care provider. This includes any fiber supplements. This information is not intended to replace advice given to you by your health care provider. Make sure you discuss any questions you have with your health care provider. Document Revised: 11/24/2018 Document Reviewed: 11/24/2018 Elsevier Patient Education  2021 [employer].

## 2020-03-06 ENCOUNTER — Ambulatory Visit: Payer: BC Managed Care – PPO | Admitting: Nurse Practitioner

## 2020-04-09 ENCOUNTER — Other Ambulatory Visit: Payer: Self-pay

## 2020-04-09 ENCOUNTER — Ambulatory Visit (INDEPENDENT_AMBULATORY_CARE_PROVIDER_SITE_OTHER): Payer: BC Managed Care – PPO | Admitting: Nurse Practitioner

## 2020-04-09 ENCOUNTER — Encounter: Payer: Self-pay | Admitting: Nurse Practitioner

## 2020-04-09 VITALS — BP 102/70 | HR 74 | Temp 97.4°F | Ht 60.25 in | Wt 187.2 lb

## 2020-04-09 DIAGNOSIS — F411 Generalized anxiety disorder: Secondary | ICD-10-CM | POA: Insufficient documentation

## 2020-04-09 DIAGNOSIS — Z0001 Encounter for general adult medical examination with abnormal findings: Secondary | ICD-10-CM

## 2020-04-09 DIAGNOSIS — Z136 Encounter for screening for cardiovascular disorders: Secondary | ICD-10-CM

## 2020-04-09 DIAGNOSIS — E6609 Other obesity due to excess calories: Secondary | ICD-10-CM | POA: Diagnosis not present

## 2020-04-09 DIAGNOSIS — Z13220 Encounter for screening for lipoid disorders: Secondary | ICD-10-CM | POA: Diagnosis not present

## 2020-04-09 DIAGNOSIS — Z6836 Body mass index (BMI) 36.0-36.9, adult: Secondary | ICD-10-CM

## 2020-04-09 DIAGNOSIS — J45909 Unspecified asthma, uncomplicated: Secondary | ICD-10-CM | POA: Insufficient documentation

## 2020-04-09 LAB — CBC WITH DIFFERENTIAL/PLATELET
Basophils Absolute: 0 10*3/uL (ref 0.0–0.1)
Basophils Relative: 0.6 % (ref 0.0–3.0)
Eosinophils Absolute: 0.2 10*3/uL (ref 0.0–0.7)
Eosinophils Relative: 3.2 % (ref 0.0–5.0)
HCT: 37 % (ref 36.0–46.0)
Hemoglobin: 12 g/dL (ref 12.0–15.0)
Lymphocytes Relative: 35.1 % (ref 12.0–46.0)
Lymphs Abs: 2.3 10*3/uL (ref 0.7–4.0)
MCHC: 32.6 g/dL (ref 30.0–36.0)
MCV: 86.6 fl (ref 78.0–100.0)
Monocytes Absolute: 0.4 10*3/uL (ref 0.1–1.0)
Monocytes Relative: 5.4 % (ref 3.0–12.0)
Neutro Abs: 3.6 10*3/uL (ref 1.4–7.7)
Neutrophils Relative %: 55.7 % (ref 43.0–77.0)
Platelets: 332 10*3/uL (ref 150.0–400.0)
RBC: 4.27 Mil/uL (ref 3.87–5.11)
RDW: 15.4 % (ref 11.5–15.5)
WBC: 6.5 10*3/uL (ref 4.0–10.5)

## 2020-04-09 LAB — LIPID PANEL
Cholesterol: 166 mg/dL (ref 0–200)
HDL: 60.8 mg/dL (ref >39.00)
LDL Cholesterol: 93 mg/dL (ref 0–99)
NonHDL: 105.36
Total CHOL/HDL Ratio: 3
Triglycerides: 60 mg/dL (ref 0.0–149.0)
VLDL: 12 mg/dL (ref 0.0–40.0)

## 2020-04-09 LAB — COMPREHENSIVE METABOLIC PANEL
ALT: 10 U/L (ref 0–35)
AST: 10 U/L (ref 0–37)
Albumin: 4.3 g/dL (ref 3.5–5.2)
Alkaline Phosphatase: 64 U/L (ref 39–117)
BUN: 10 mg/dL (ref 6–23)
CO2: 29 mEq/L (ref 19–32)
Calcium: 9.4 mg/dL (ref 8.4–10.5)
Chloride: 103 mEq/L (ref 96–112)
Creatinine, Ser: 0.77 mg/dL (ref 0.40–1.20)
GFR: 107.8 mL/min (ref >60.00)
Glucose, Bld: 88 mg/dL (ref 70–99)
Potassium: 4.2 mEq/L (ref 3.5–5.1)
Sodium: 139 mEq/L (ref 135–145)
Total Bilirubin: 0.3 mg/dL (ref 0.2–1.2)
Total Protein: 7.7 g/dL (ref 6.0–8.3)

## 2020-04-09 LAB — TSH: TSH: 3.96 u[IU]/mL (ref 0.35–4.50)

## 2020-04-09 NOTE — Progress Notes (Signed)
Subjective:    Patient ID: Amy Lambert, female    DOB: [birth year], 25 y.o.   MRN: [id number]  Patient presents today for CPE  HPI GAD (generalized anxiety disorder) Chronic, slowly worsening in last 1year due to work stress and lack of exercise. Declined use of medication at this time. Entered referral to psychology. F/up in 55months  Sexual History (orientation,birth control, marital status, STD):IUD in place, pelvic and breast exam done by GYN, denies need for STD test today  Depression/Suicide: Depression screen [facility] 2/9 04/09/2020 10/27/2017 09/25/2016 04/01/2016 10/01/2015 09/11/2015 09/10/2014  Decreased Interest 0 0 0 0 0 0 0  Down, Depressed, Hopeless 0 0 0 0 0 0 0  PHQ - 2 Score 0 0 0 0 0 0 0  Altered sleeping 2 - - - - - -  Tired, decreased energy 0 - - - - - -  Change in appetite 0 - - - - - -  Feeling bad or failure about yourself  0 - - - - - -  Trouble concentrating 0 - - - - - -  Moving slowly or fidgety/restless 0 - - - - - -  Suicidal thoughts 0 - - - - - -  PHQ-9 Score 2 - - - - - -  Difficult doing work/chores Not difficult at all - - - - - -   Vision:will schedule  Dental:up to date  Immunizations: (TDAP, Hep C screen, Pneumovax, Influenza, zoster)  Health Maintenance  Topic Date Due  . COVID-19 Vaccine (1) Never done  . HPV Vaccine (1 - 2-dose series) Never done  . Tetanus Vaccine  Never done  . Flu Shot  Never done  . Pap Smear  10/16/2020  . Pap Smear  10/16/2020  .  Hepatitis C: One time screening is recommended by Center for Disease Control  (CDC) for  adults born from [birth year] through 1965.   Completed  . HIV Screening  Completed   Diet:regular.  Weight:  Wt Readings from Last 3 Encounters:  04/09/20 187 lb 3.2 oz (84.9 kg)  02/07/20 188 lb 9.6 oz (85.5 kg)  07/15/19 178 lb (80.7 kg)   Fall Risk: Fall Risk  10/27/2017 09/25/2016 04/01/2016 10/01/2015 09/11/2015  Falls in the past year? No No No No No   Medications and allergies reviewed with patient  and updated if appropriate.  Patient Active Problem List   Diagnosis Date Noted  . Asthma 04/09/2020  . GAD (generalized anxiety disorder) 04/09/2020  . Recurrent tonsillitis 07/15/2019  . Umbilical hernia, incarcerated 09/24/2018  . Anemia 12/13/2014  . Menorrhagia   . Dysmenorrhea    Current Outpatient Medications on File Prior to Visit  Medication Sig Dispense Refill  . levonorgestrel (KYLEENA) 19.5 MG IUD     . naproxen (NAPROSYN) 250 MG tablet Take by mouth 2 (two) times daily with a meal.     No current facility-administered medications on file prior to visit.    Past Medical History:  Diagnosis Date  . Colitis   . Dysmenorrhea   . History of kidney stones   . Kidney infection    2X from UTI   . Menorrhagia   . UTI (lower urinary tract infection)     Past Surgical History:  Procedure Laterality Date  . WISDOM TOOTH EXTRACTION    . WRIST SURGERY Left    Wrist Set after broken under general anesthesia    Social History   Socioeconomic History  . Marital status: Single    Spouse  name: Not on file  . Number of children: Not on file  . Years of education: Not on file  . Highest education level: Not on file  Occupational History  . Not on file  Tobacco Use  . Smoking status: Never Smoker  . Smokeless tobacco: Never Used  Vaping Use  . Vaping Use: Never used  Substance and Sexual Activity  . Alcohol use: Yes    Comment: occ  . Drug use: No  . Sexual activity: Not on file  Other Topics Concern  . Not on file  Social History Narrative  . Not on file   Social Determinants of Health   Financial Resource Strain: Not on file  Food Insecurity: Not on file  Transportation Needs: Not on file  Physical Activity: Not on file  Stress: Not on file  Social Connections: Not on file    Family History  Problem Relation Age of Onset  . Hypertension Paternal Grandmother   . Diabetes Maternal Grandmother   . Hypertension Maternal Grandmother   . Heart attack  Maternal Grandmother   . Migraines Mother         Review of Systems  Constitutional: Negative for fever, malaise/fatigue and weight loss.  HENT: Negative for congestion and sore throat.   Eyes:       Negative for visual changes  Respiratory: Negative for cough and shortness of breath.   Cardiovascular: Negative for chest pain, palpitations and leg swelling.  Gastrointestinal: Negative for blood in stool, constipation, diarrhea and heartburn.  Genitourinary: Negative for dysuria, frequency and urgency.  Musculoskeletal: Negative for falls, joint pain and myalgias.  Skin: Negative for rash.  Neurological: Negative for dizziness, sensory change and headaches.  Endo/Heme/Allergies: Does not bruise/bleed easily.  Psychiatric/Behavioral: Positive for depression. Negative for hallucinations, memory loss, substance abuse and suicidal ideas. The patient is nervous/anxious. The patient does not have insomnia.     Objective:   Vitals:   04/09/20 0934  BP: 102/70  Pulse: 74  Temp: (!) 97.4 F (36.3 C)  SpO2: 98%    Body mass index is 36.26 kg/m.   Physical Examination:  Physical Exam Vitals and nursing note reviewed.  Constitutional:      General: She is not in acute distress.    Appearance: She is obese.  HENT:     Right Ear: Tympanic membrane, ear canal and external ear normal.     Left Ear: Tympanic membrane, ear canal and external ear normal.  Eyes:     General: No scleral icterus.    Extraocular Movements: Extraocular movements intact.     Conjunctiva/sclera: Conjunctivae normal.  Neck:     Thyroid: No thyromegaly.  Cardiovascular:     Rate and Rhythm: Normal rate and regular rhythm.     Pulses: Normal pulses.     Heart sounds: Normal heart sounds.  Pulmonary:     Effort: Pulmonary effort is normal.     Breath sounds: Normal breath sounds.  Chest:     Chest wall: No tenderness.  Abdominal:     General: Bowel sounds are normal. There is no distension.      Palpations: Abdomen is soft.     Tenderness: There is no abdominal tenderness.  Genitourinary:    Comments: Deferred breast and pelvic exam to GYN Musculoskeletal:        General: No tenderness. Normal range of motion.     Cervical back: Normal range of motion and neck supple.  Lymphadenopathy:     Cervical: No  cervical adenopathy.  Skin:    General: Skin is warm and dry.  Neurological:     Mental Status: She is alert and oriented to person, place, and time.  Psychiatric:        Mood and Affect: Mood normal.        Behavior: Behavior normal.        Thought Content: Thought content normal.    ASSESSMENT and PLAN: This visit occurred during the SARS-CoV-2 public health emergency.  Safety protocols were in place, including screening questions prior to the visit, additional usage of staff PPE, and extensive cleaning of exam room while observing appropriate contact time as indicated for disinfecting solutions.   Amy Lambert was seen today for annual exam.  Diagnoses and all orders for this visit:  Encounter for preventative adult health care exam with abnormal findings -     CBC with Differential/Platelet -     Comprehensive metabolic panel  Encounter for lipid screening for cardiovascular disease -     Lipid panel  Class 2 obesity due to excess calories without serious comorbidity with body mass index (BMI) of 36.0 to 36.9 in adult -     TSH -     Lipid panel  GAD (generalized anxiety disorder) -     Ambulatory referral to Psychology  Have report from GYN faxed to me once completed. Schedule appt for annual eye exam. Continue heart healthy diet and daily exercise. You will be contacted to schedule appt with therapist.    Problem List Items Addressed This Visit      Other   GAD (generalized anxiety disorder)    Chronic, slowly worsening in last 1year due to work stress and lack of exercise. Declined use of medication at this time. Entered referral to psychology. F/up in  63months      Relevant Orders   Ambulatory referral to Psychology    Other Visit Diagnoses    Encounter for preventative adult health care exam with abnormal findings    -  Primary   Relevant Orders   CBC with Differential/Platelet   Comprehensive metabolic panel   Encounter for lipid screening for cardiovascular disease       Relevant Orders   Lipid panel   Class 2 obesity due to excess calories without serious comorbidity with body mass index (BMI) of 36.0 to 36.9 in adult       Relevant Orders   TSH   Lipid panel      Follow up: Return in about 3 months (around 07/10/2020) for Anxiety.  Alysia Penna, NP

## 2020-04-09 NOTE — Patient Instructions (Addendum)
Have report from GYN faxed to me once completed. Schedule appt for annual eye exam.  Continue heart healthy diet and daily exercise.  You will be contacted to schedule appt with therapist.  Go to lab for blood draw.  Preventive Care 27-25 Years Old, Female Preventive care refers to lifestyle choices and visits with your health care provider that can promote health and wellness. This includes:  A yearly physical exam. This is also called an annual wellness visit.  Regular dental and eye exams.  Immunizations.  Screening for certain conditions.  Healthy lifestyle choices, such as: ? Eating a healthy diet. ? Getting regular exercise. ? Not using drugs or products that contain nicotine and tobacco. ? Limiting alcohol use. What can I expect for my preventive care visit? Physical exam Your health care provider may check your:  Height and weight. These may be used to calculate your BMI (body mass index). BMI is a measurement that tells if you are at a healthy weight.  Heart rate and blood pressure.  Body temperature.  Skin for abnormal spots. Counseling Your health care provider may ask you questions about your:  Past medical problems.  Family's medical history.  Alcohol, tobacco, and drug use.  Emotional well-being.  Home life and relationship well-being.  Sexual activity.  Diet, exercise, and sleep habits.  Work and work Statistician.  Access to firearms.  Method of birth control.  Menstrual cycle.  Pregnancy history. What immunizations do I need? Vaccines are usually given at various ages, according to a schedule. Your health care provider will recommend vaccines for you based on your age, medical history, and lifestyle or other factors, such as travel or where you work.   What tests do I need? Blood tests  Lipid and cholesterol levels. These may be checked every 5 years starting at age 69.  Hepatitis C test.  Hepatitis B test. Screening  Diabetes  screening. This is done by checking your blood sugar (glucose) after you have not eaten for a while (fasting).  STD (sexually transmitted disease) testing, if you are at risk.  BRCA-related cancer screening. This may be done if you have a family history of breast, ovarian, tubal, or peritoneal cancers.  Pelvic exam and Pap test. This may be done every 3 years starting at age 72. Starting at age 21, this may be done every 5 years if you have a Pap test in combination with an HPV test. Talk with your health care provider about your test results, treatment options, and if necessary, the need for more tests.   Follow these instructions at home: Eating and drinking  Eat a healthy diet that includes fresh fruits and vegetables, whole grains, lean protein, and low-fat dairy products.  Take vitamin and mineral supplements as recommended by your health care provider.  Do not drink alcohol if: ? Your health care provider tells you not to drink. ? You are pregnant, may be pregnant, or are planning to become pregnant.  If you drink alcohol: ? Limit how much you have to 0-1 drink a day. ? Be aware of how much alcohol is in your drink. In the [place] one drink equals one 12 oz bottle of beer (355 mL), one 5 oz glass of wine (148 mL), or one 1 oz glass of hard liquor (44 mL).   Lifestyle  Take daily care of your teeth and gums. Brush your teeth every morning and night with fluoride toothpaste. Floss one time each day.  Stay active. Exercise for  at least 30 minutes 5 or more days each week.  Do not use any products that contain nicotine or tobacco, such as cigarettes, e-cigarettes, and chewing tobacco. If you need help quitting, ask your health care provider.  Do not use drugs.  If you are sexually active, practice safe sex. Use a condom or other form of protection to prevent STIs (sexually transmitted infections).  If you do not wish to become pregnant, use a form of birth control. If you plan to  become pregnant, see your health care provider for a prepregnancy visit.  Find healthy ways to cope with stress, such as: ? Meditation, yoga, or listening to music. ? Journaling. ? Talking to a trusted person. ? Spending time with friends and family. Safety  Always wear your seat belt while driving or riding in a vehicle.  Do not drive: ? If you have been drinking alcohol. Do not ride with someone who has been drinking. ? When you are tired or distracted. ? While texting.  Wear a helmet and other protective equipment during sports activities.  If you have firearms in your house, make sure you follow all gun safety procedures.  Seek help if you have been physically or sexually abused. What's next?  Go to your health care provider once a year for an annual wellness visit.  Ask your health care provider how often you should have your eyes and teeth checked.  Stay up to date on all vaccines. This information is not intended to replace advice given to you by your health care provider. Make sure you discuss any questions you have with your health care provider. Document Revised: 09/04/2019 Document Reviewed: 09/17/2017 Elsevier Patient Education  2021 [employer].

## 2020-04-09 NOTE — Assessment & Plan Note (Signed)
Chronic, slowly worsening in last 1year due to work stress and lack of exercise. Declined use of medication at this time. Entered referral to psychology. F/up in 48months

## 2020-07-10 ENCOUNTER — Ambulatory Visit: Payer: BC Managed Care – PPO | Admitting: Nurse Practitioner

## 2020-07-11 ENCOUNTER — Ambulatory Visit: Payer: BC Managed Care – PPO | Admitting: Gastroenterology

## 2020-11-19 ENCOUNTER — Telehealth: Payer: Self-pay

## 2020-11-19 NOTE — Telephone Encounter (Signed)
Transition Care Management Follow-up Telephone Call Date of discharge and from where: 11/13/2020  [facility] How have you been since you were released from the hospital? " Doing very well" Any questions or concerns? No  Items Reviewed: Did the pt receive and understand the discharge instructions provided? Yes  Medications obtained and verified? Yes  Other? No  Any new allergies since your discharge? No  Dietary orders reviewed? Yes Do you have support at home? Yes   Home Care and Equipment/Supplies: Were home health services ordered? no If so, what is the name of the agency?  Has the agency set up a time to come to the patient's home?  Were any new equipment or medical supplies ordered?   What is the name of the medical supply agency?  Were you able to get the supplies/equipment?  Do you have any questions related to the use of the equipment or supplies?   Functional Questionnaire: (I = Independent and D = Dependent) ADLs: I  Bathing/Dressing- I  Meal Prep- I  Eating- I  Maintaining continence- I  Transferring/Ambulation- I  Managing Meds- I  Follow up appointments reviewed:  PCP Hospital f/u appt confirmed? No   Specialist Hospital f/u appt confirmed? Yes  Scheduled to see GI 11/22/2020  Are transportation arrangements needed?  If their condition worsens, is the pt aware to call PCP or go to the Emergency Dept.? yes Was the patient provided with contact information for the PCP's office or ED? yes Was to pt encouraged to call back with questions or concerns? Yes  Rowe Pavy, RN, BSN, CEN [facility] [employer] [phone]

## 2021-07-11 ENCOUNTER — Encounter: Payer: Self-pay | Admitting: Nurse Practitioner

## 2021-07-11 ENCOUNTER — Ambulatory Visit (INDEPENDENT_AMBULATORY_CARE_PROVIDER_SITE_OTHER): Payer: BC Managed Care – PPO | Admitting: Nurse Practitioner

## 2021-07-11 VITALS — BP 116/80 | HR 63 | Temp 97.2°F | Ht 60.0 in | Wt 200.8 lb

## 2021-07-11 DIAGNOSIS — Z0001 Encounter for general adult medical examination with abnormal findings: Secondary | ICD-10-CM

## 2021-07-11 DIAGNOSIS — Z23 Encounter for immunization: Secondary | ICD-10-CM

## 2021-07-11 DIAGNOSIS — Z6839 Body mass index (BMI) 39.0-39.9, adult: Secondary | ICD-10-CM

## 2021-07-11 DIAGNOSIS — K581 Irritable bowel syndrome with constipation: Secondary | ICD-10-CM | POA: Diagnosis not present

## 2021-07-11 DIAGNOSIS — F411 Generalized anxiety disorder: Secondary | ICD-10-CM | POA: Diagnosis not present

## 2021-07-11 DIAGNOSIS — E6609 Other obesity due to excess calories: Secondary | ICD-10-CM | POA: Insufficient documentation

## 2021-07-11 DIAGNOSIS — E66812 Obesity, class 2: Secondary | ICD-10-CM

## 2021-07-11 DIAGNOSIS — N920 Excessive and frequent menstruation with regular cycle: Secondary | ICD-10-CM

## 2021-07-11 NOTE — Assessment & Plan Note (Signed)
onset at age 26, bloating and ABD pain. Eval by GI: colonoscopy completed 11/2020: colon and rectal nodules: benign biopsy. Improved with high fiber diet, exercise and use of magnesium daily.

## 2021-07-11 NOTE — Progress Notes (Signed)
Complete physical exam  Patient: Amy Lambert   DOB: Mar 14, 1995   25 y.o. Female  MRN: [id number] Visit Date: 07/11/2021  Subjective:    Chief Complaint  Patient presents with   Annual Exam    CPE Pt not fasting Wants to check thyroid levels Requested records for OBGYN Tdap & HPV vaccine given today    Amy Lambert is a 26 y.o. female who presents today for a complete physical exam. She reports consuming a general diet. Gym/ health club routine includes cardio and mod to heavy weightlifting. She generally feels well. She reports sleeping well. She does have additional problems to discuss today.  Vision:Within the last year Dental:No STD Screen:No   Most recent fall risk assessment:    10/27/2017   10:06 AM  Fall Risk   Falls in the past year? No     Most recent depression screenings:    07/11/2021    2:21 PM 04/09/2020   10:16 AM  PHQ 2/9 Scores  PHQ - 2 Score 0 0  PHQ- 9 Score 0 2    HPI  Class 2 obesity due to excess calories without serious comorbidity with body mass index (BMI) of 39.0 to 39.9 in adult Diet: low carb, low fat, low sugar, high fiber, high  Caloric intake: 1700kcal/day Wt Readings from Last 3 Encounters:  07/11/21 200 lb 12.8 oz (91.1 kg)  04/09/20 187 lb 3.2 oz (84.9 kg)  02/07/20 188 lb 9.6 oz (85.5 kg)   Check CMP, TSH, lipid, and hgbA1c Continue daily exercise and heart healthy diet F/up in 62months  GAD (generalized anxiety disorder) Resolved with use of magnesium 1tab daily  Menorrhagia Resolved with IUD inserted 2019  Irritable bowel syndrome with constipation onset at age 66, bloating and ABD pain. Eval by GI: colonoscopy completed 11/2020: colon and rectal nodules: benign biopsy. Improved with high fiber diet, exercise and use of magnesium daily.   Past Medical History:  Diagnosis Date   Anemia 12/13/2014   Colitis    Dysmenorrhea    History of kidney stones    Kidney infection    2X from UTI    Menorrhagia    UTI  (lower urinary tract infection)    Past Surgical History:  Procedure Laterality Date   WISDOM TOOTH EXTRACTION     WRIST SURGERY Left    Wrist Set after broken under general anesthesia   Social History   Socioeconomic History   Marital status: Single    Spouse name: Not on file   Number of children: Not on file   Years of education: Not on file   Highest education level: Not on file  Occupational History   Not on file  Tobacco Use   Smoking status: Never   Smokeless tobacco: Never  Vaping Use   Vaping Use: Never used  Substance and Sexual Activity   Alcohol use: Yes    Comment: occ   Drug use: No   Sexual activity: Not on file  Other Topics Concern   Not on file  Social History Narrative   Not on file   Social Determinants of Health   Financial Resource Strain: Not on file  Food Insecurity: Not on file  Transportation Needs: Not on file  Physical Activity: Not on file  Stress: Not on file  Social Connections: Not on file  Intimate Partner Violence: Not on file   Family Status  Relation Name Status   PGM  Alive   MGM  Deceased  Mother  Alive   Father  Alive   MGF  Deceased   PGF  Alive   Family History  Problem Relation Age of Onset   Hypertension Paternal Grandmother    Diabetes Maternal Grandmother    Hypertension Maternal Grandmother    Heart attack Maternal Grandmother    Migraines Mother    Allergies  Allergen Reactions   Penicillins Hives    & fever & fever    Patient Care Team: Ravan Schlemmer, Bonna Gains, NP as PCP - General (Internal Medicine) Silverio Lay, MD as Consulting Physician (Obstetrics and Gynecology)   Medications: Outpatient Medications Prior to Visit  Medication Sig   levonorgestrel (KYLEENA) 19.5 MG IUD    naproxen (NAPROSYN) 250 MG tablet Take by mouth 2 (two) times daily with a meal.   [DISCONTINUED] triamcinolone acetonide (KENALOG) 10 MG/ML injection Kenalog 10 mg/mL suspension for injection  Take 1 mL by injection  route.   No facility-administered medications prior to visit.    Review of Systems  Constitutional:  Negative for fever.  HENT:  Negative for congestion and sore throat.   Eyes:        Negative for visual changes  Respiratory:  Negative for cough and shortness of breath.   Cardiovascular:  Negative for chest pain, palpitations and leg swelling.  Gastrointestinal:  Positive for constipation. Negative for blood in stool, diarrhea, nausea and rectal pain.  Genitourinary:  Negative for dysuria, frequency and urgency.  Musculoskeletal:  Negative for myalgias.  Skin:  Negative for rash.  Neurological:  Negative for dizziness and headaches.  Hematological:  Does not bruise/bleed easily.  Psychiatric/Behavioral:  Negative for suicidal ideas. The patient is not nervous/anxious.        Objective:  BP 116/80 (BP Location: Right Arm, Patient Position: Sitting, Cuff Size: Normal)   Pulse 63   Temp (!) 97.2 F (36.2 C) (Temporal)   Ht 5' (1.524 m)   Wt 200 lb 12.8 oz (91.1 kg)   SpO2 95%   BMI 39.22 kg/m     BP Readings from Last 3 Encounters:  07/11/21 116/80  04/09/20 102/70  02/07/20 110/82   Wt Readings from Last 3 Encounters:  07/11/21 200 lb 12.8 oz (91.1 kg)  04/09/20 187 lb 3.2 oz (84.9 kg)  02/07/20 188 lb 9.6 oz (85.5 kg)      Physical Exam Vitals reviewed.  Constitutional:      General: She is not in acute distress.    Appearance: She is obese.  HENT:     Right Ear: Tympanic membrane, ear canal and external ear normal.     Left Ear: Tympanic membrane, ear canal and external ear normal.     Mouth/Throat:     Pharynx: No oropharyngeal exudate.  Eyes:     General: No scleral icterus.    Extraocular Movements: Extraocular movements intact.     Conjunctiva/sclera: Conjunctivae normal.  Cardiovascular:     Rate and Rhythm: Normal rate and regular rhythm.     Pulses: Normal pulses.     Heart sounds: Normal heart sounds.  Pulmonary:     Effort: Pulmonary effort  is normal. No respiratory distress.     Breath sounds: Normal breath sounds.  Abdominal:     General: Bowel sounds are normal. There is no distension.     Palpations: Abdomen is soft.  Genitourinary:    Comments: Breast and pelvic exam completed by GYN Musculoskeletal:        General: Normal range of motion.  Cervical back: Normal range of motion and neck supple.  Lymphadenopathy:     Cervical: No cervical adenopathy.  Skin:    General: Skin is warm and dry.  Neurological:     Mental Status: She is alert and oriented to person, place, and time.  Psychiatric:        Mood and Affect: Mood normal.        Behavior: Behavior normal.        Thought Content: Thought content normal.     No results found for any visits on 07/11/21.    Assessment & Plan:    Routine Health Maintenance and Physical Exam   There is no immunization history on file for this patient.  Health Maintenance  Topic Date Due   HPV VACCINES (1 - 2-dose series) Never done   TETANUS/TDAP  Never done   PAP-Cervical Cytology Screening  10/16/2020   PAP SMEAR-Modifier  10/16/2020   COVID-19 Vaccine (1) 07/27/2021 (Originally 02/18/1996)   INFLUENZA VACCINE  08/20/2021   Hepatitis C Screening  Completed   HIV Screening  Completed    Discussed health benefits of physical activity, and encouraged her to engage in regular exercise appropriate for her age and condition.  Problem List Items Addressed This Visit       Digestive   Irritable bowel syndrome with constipation    onset at age 64, bloating and ABD pain. Eval by GI: colonoscopy completed 11/2020: colon and rectal nodules: benign biopsy. Improved with high fiber diet, exercise and use of magnesium daily.        Other   Class 2 obesity due to excess calories without serious comorbidity with body mass index (BMI) of 39.0 to 39.9 in adult    Diet: low carb, low fat, low sugar, high fiber, high  Caloric intake: 1700kcal/day Wt Readings from Last 3  Encounters:  07/11/21 200 lb 12.8 oz (91.1 kg)  04/09/20 187 lb 3.2 oz (84.9 kg)  02/07/20 188 lb 9.6 oz (85.5 kg)  Check CMP, TSH, lipid, and hgbA1c Continue daily exercise and heart healthy diet F/up in 27months      Relevant Orders   TSH   Hemoglobin A1c   Lipid panel   GAD (generalized anxiety disorder)    Resolved with use of magnesium 1tab daily      Menorrhagia    Resolved with IUD inserted 2019      Other Visit Diagnoses     Encounter for preventative adult health care exam with abnormal findings    -  Primary   Relevant Orders   Comprehensive metabolic panel      Return in about 3 months (around 10/11/2021) for weight management.     Wilfred Lacy, NP

## 2021-07-11 NOTE — Assessment & Plan Note (Signed)
Resolved with use of magnesium 1tab daily

## 2021-07-11 NOTE — Assessment & Plan Note (Signed)
Diet: low carb, low fat, low sugar, high fiber, high  Caloric intake: 1700kcal/day Wt Readings from Last 3 Encounters:  07/11/21 200 lb 12.8 oz (91.1 kg)  04/09/20 187 lb 3.2 oz (84.9 kg)  02/07/20 188 lb 9.6 oz (85.5 kg)   Check CMP, TSH, lipid, and hgbA1c Continue daily exercise and heart healthy diet F/up in 17months

## 2021-07-11 NOTE — Assessment & Plan Note (Signed)
Resolved with IUD inserted 2019

## 2021-07-11 NOTE — Patient Instructions (Addendum)
For constipation: chia seeds 1Tsp or miralax 17g or benefiber 1sp daily.  Continue daily exercise and heart  Multivitamin: alive or nature-made (calcium 600-846m and vit. D 1000IU)  Schedule appt for fasting blood draw  Preventive Care 89 Old, Female Preventive care refers to lifestyle choices and visits with your health care provider that can promote health and wellness. Preventive care visits are also called wellness exams. What can I expect for my preventive care visit? Counseling During your preventive care visit, your health care provider may ask about your: Medical history, including: Past medical problems. Family medical history. Pregnancy history. Current health, including: Menstrual cycle. Method of birth control. Emotional well-being. Home life and relationship well-being. Sexual activity and sexual health. Lifestyle, including: Alcohol, nicotine or tobacco, and drug use. Access to firearms. Diet, exercise, and sleep habits. Work and work eStatistician Sunscreen use. Safety issues such as seatbelt and bike helmet use. Physical exam Your health care provider may check your: Height and weight. These may be used to calculate your BMI (body mass index). BMI is a measurement that tells if you are at a healthy weight. Waist circumference. This measures the distance around your waistline. This measurement also tells if you are at a healthy weight and may help predict your risk of certain diseases, such as type 2 diabetes and high blood pressure. Heart rate and blood pressure. Body temperature. Skin for abnormal spots. What immunizations do I need?  Vaccines are usually given at various ages, according to a schedule. Your health care provider will recommend vaccines for you based on your age, medical history, and lifestyle or other factors, such as travel or where you work. What tests do I need? Screening Your health care provider may recommend screening tests for  certain conditions. This may include: Pelvic exam and Pap test. Lipid and cholesterol levels. Diabetes screening. This is done by checking your blood sugar (glucose) after you have not eaten for a while (fasting). Hepatitis B test. Hepatitis C test. HIV (human immunodeficiency virus) test. STI (sexually transmitted infection) testing, if you are at risk. BRCA-related cancer screening. This may be done if you have a family history of breast, ovarian, tubal, or peritoneal cancers. Talk with your health care provider about your test results, treatment options, and if necessary, the need for more tests. Follow these instructions at home: Eating and drinking  Eat a healthy diet that includes fresh fruits and vegetables, whole grains, lean protein, and low-fat dairy products. Take vitamin and mineral supplements as recommended by your health care provider. Do not drink alcohol if: Your health care provider tells you not to drink. You are pregnant, may be pregnant, or are planning to become pregnant. If you drink alcohol: Limit how much you have to 0-1 drink a day. Know how much alcohol is in your drink. In the [place] one drink equals one 12 oz bottle of beer (355 mL), one 5 oz glass of wine (148 mL), or one 1 oz glass of hard liquor (44 mL). Lifestyle Brush your teeth every morning and night with fluoride toothpaste. Floss one time each day. Exercise for at least 30 minutes 5 or more days each week. Do not use any products that contain nicotine or tobacco. These products include cigarettes, chewing tobacco, and vaping devices, such as e-cigarettes. If you need help quitting, ask your health care provider. Do not use drugs. If you are sexually active, practice safe sex. Use a condom or other form of protection to prevent STIs. If  you do not wish to become pregnant, use a form of birth control. If you plan to become pregnant, see your health care provider for a prepregnancy visit. Find healthy  ways to manage stress, such as: Meditation, yoga, or listening to music. Journaling. Talking to a trusted person. Spending time with friends and family. Minimize exposure to UV radiation to reduce your risk of skin cancer. Safety Always wear your seat belt while driving or riding in a vehicle. Do not drive: If you have been drinking alcohol. Do not ride with someone who has been drinking. If you have been using any mind-altering substances or drugs. While texting. When you are tired or distracted. Wear a helmet and other protective equipment during sports activities. If you have firearms in your house, make sure you follow all gun safety procedures. Seek help if you have been physically or sexually abused. What's next? Go to your health care provider once a year for an annual wellness visit. Ask your health care provider how often you should have your eyes and teeth checked. Stay up to date on all vaccines. This information is not intended to replace advice given to you by your health care provider. Make sure you discuss any questions you have with your health care provider. Document Revised: 07/04/2020 Document Reviewed: 07/04/2020 Elsevier Patient Education  [place].

## 2021-07-12 ENCOUNTER — Other Ambulatory Visit (INDEPENDENT_AMBULATORY_CARE_PROVIDER_SITE_OTHER): Payer: BC Managed Care – PPO

## 2021-07-12 DIAGNOSIS — E6609 Other obesity due to excess calories: Secondary | ICD-10-CM | POA: Diagnosis not present

## 2021-07-12 DIAGNOSIS — Z0001 Encounter for general adult medical examination with abnormal findings: Secondary | ICD-10-CM

## 2021-07-12 DIAGNOSIS — Z6839 Body mass index (BMI) 39.0-39.9, adult: Secondary | ICD-10-CM | POA: Diagnosis not present

## 2021-07-12 DIAGNOSIS — E66812 Obesity, class 2: Secondary | ICD-10-CM

## 2021-07-12 LAB — LIPID PANEL
Cholesterol: 149 mg/dL (ref 0–200)
HDL: 52.6 mg/dL (ref >39.00)
LDL Cholesterol: 86 mg/dL (ref 0–99)
NonHDL: 96.42
Total CHOL/HDL Ratio: 3
Triglycerides: 52 mg/dL (ref 0.0–149.0)
VLDL: 10.4 mg/dL (ref 0.0–40.0)

## 2021-07-12 LAB — COMPREHENSIVE METABOLIC PANEL
ALT: 12 U/L (ref 0–35)
AST: 14 U/L (ref 0–37)
Albumin: 3.8 g/dL (ref 3.5–5.2)
Alkaline Phosphatase: 49 U/L (ref 39–117)
BUN: 11 mg/dL (ref 6–23)
CO2: 27 mEq/L (ref 19–32)
Calcium: 8.8 mg/dL (ref 8.4–10.5)
Chloride: 105 mEq/L (ref 96–112)
Creatinine, Ser: 0.69 mg/dL (ref 0.40–1.20)
GFR: 120.21 mL/min (ref >60.00)
Glucose, Bld: 80 mg/dL (ref 70–99)
Potassium: 4.1 mEq/L (ref 3.5–5.1)
Sodium: 138 mEq/L (ref 135–145)
Total Bilirubin: 0.2 mg/dL (ref 0.2–1.2)
Total Protein: 7.2 g/dL (ref 6.0–8.3)

## 2021-07-12 LAB — HEMOGLOBIN A1C: Hgb A1c MFr Bld: 5.9 % (ref 4.6–6.5)

## 2021-07-12 LAB — TSH: TSH: 2.19 u[IU]/mL (ref 0.35–5.50)

## 2021-10-28 ENCOUNTER — Encounter (INDEPENDENT_AMBULATORY_CARE_PROVIDER_SITE_OTHER): Payer: BC Managed Care – PPO | Admitting: Family Medicine

## 2021-10-30 ENCOUNTER — Other Ambulatory Visit: Payer: Self-pay

## 2021-10-30 ENCOUNTER — Encounter (HOSPITAL_BASED_OUTPATIENT_CLINIC_OR_DEPARTMENT_OTHER): Payer: Self-pay | Admitting: Urology

## 2021-10-30 ENCOUNTER — Emergency Department (HOSPITAL_BASED_OUTPATIENT_CLINIC_OR_DEPARTMENT_OTHER)
Admission: EM | Admit: 2021-10-30 | Discharge: 2021-10-30 | Disposition: A | Discharge status: Home / Self Care | Payer: BC Managed Care – PPO | Attending: Emergency Medicine | Admitting: Emergency Medicine

## 2021-10-30 DIAGNOSIS — J0110 Acute frontal sinusitis, unspecified: Secondary | ICD-10-CM | POA: Insufficient documentation

## 2021-10-30 MED ORDER — AZITHROMYCIN 250 MG PO TABS: 250.0000 mg | ORAL_TABLET | Freq: Every day | ORAL | 0 refills | Status: DC

## 2021-10-30 MED ORDER — FLUTICASONE PROPIONATE 50 MCG/ACT NA SUSP: 2.0000 | Freq: Every day | NASAL | 0 refills | Status: DC

## 2021-10-30 NOTE — Discharge Instructions (Signed)
Please take medication as prescribed and follow with your primary care physician if symptoms worsen.

## 2021-10-30 NOTE — ED Provider Notes (Signed)
Emergency Department Provider Note   I have reviewed the triage vital signs and the nursing notes.   HISTORY  Chief Complaint Sinus Congestion   HPI Amy Lambert is a 26 y.o. female with past history reviewed below presents emergency department with facial pain, nasal congestion over the past week. Patient has been using OTC nasal decongestants and nasal sprays without relief. She is having some face pain. No severe sore throat, voice change, CP, or SOB.   Past Medical History:  Diagnosis Date   Anemia 12/13/2014   Colitis    Dysmenorrhea    History of kidney stones    Kidney infection    2X from UTI    Menorrhagia    UTI (lower urinary tract infection)     Review of Systems  Constitutional: No fever/chills Eyes: No visual changes. ENT: No sore throat. Positive nasal congestion and face pain.  Cardiovascular: Denies chest pain. Respiratory: Denies shortness of breath.    ____________________________________________   PHYSICAL EXAM:  VITAL SIGNS: ED Triage Vitals  Enc Vitals Group     BP 10/30/21 2205 129/83     Pulse Rate 10/30/21 2205 76     Resp 10/30/21 2205 20     Temp 10/30/21 2205 98.2 F (36.8 C)     Temp Source 10/30/21 2205 Oral     SpO2 10/30/21 2205 100 %     Weight 10/30/21 2206 190 lb (86.2 kg)     Height 10/30/21 2206 5\' 1"  (1.549 m)   Constitutional: Alert and oriented. Well appearing and in no acute distress. Eyes: Conjunctivae are normal. Head: Atraumatic. Ears:  Healthy appearing ear canals and TMs bilaterally Nose: Mild congestion/rhinnorhea. Inflamed turbinates bilaterally. Tenderness over the frontal and maxillary sinuses.  Mouth/Throat: Mucous membranes are moist.  Oropharynx non-erythematous. Neck: No stridor.   Cardiovascular: Normal rate, regular rhythm.  Respiratory: Normal respiratory effort Gastrointestinal: No distention.  Musculoskeletal: No gross deformities of extremities. Neurologic:  Normal speech and language.   Skin:  Skin is warm, dry and intact. No rash noted.  ____________________________________________   PROCEDURES  Procedure(s) performed:   Procedures  None  ____________________________________________   INITIAL IMPRESSION / ASSESSMENT AND PLAN / ED COURSE  Pertinent labs & imaging results that were available during my care of the patient were reviewed by me and considered in my medical decision making (see chart for details).   This patient is Presenting for Evaluation of sinus congestion, which does require a range of treatment options, and is a complaint that involves a moderate risk of morbidity and mortality.  The Differential Diagnoses include sinus infection, sinusitis, bronchitis, strep pharyngitis, etc.   Medical Decision Making: Summary:  Patient with sinus pain and congestion x 1 week. No CP. No sore throat. Exam not consistent with strep or PTA. Plan for Z pack, flonase, and nasal irrigation PRN.   Disposition: discharge  ____________________________________________  FINAL CLINICAL IMPRESSION(S) / ED DIAGNOSES  Final diagnoses:  Acute non-recurrent frontal sinusitis    NEW OUTPATIENT MEDICATIONS STARTED DURING THIS VISIT:  New Prescriptions   AZITHROMYCIN (ZITHROMAX) 250 MG TABLET    Take 1 tablet (250 mg total) by mouth daily. Take first 2 tablets together, then 1 every day until finished.   FLUTICASONE (FLONASE) 50 MCG/ACT NASAL SPRAY    Place 2 sprays into both nostrils daily for 7 days.    Note:  This document was prepared using Dragon voice recognition software and may include unintentional dictation errors.  Nanda Quinton, MD, [facility] Emergency  Medicine    Eloisa Chokshi, [facility], MD 10/30/21 2326

## 2021-10-30 NOTE — ED Triage Notes (Signed)
Pt states severe sinus congestion x 1 week and sob with exertion  States unable to breathe through nose, feels ears popping with swallowing Denies fever, denies cough  Covid 9/20

## 2021-11-21 ENCOUNTER — Ambulatory Visit (INDEPENDENT_AMBULATORY_CARE_PROVIDER_SITE_OTHER): Payer: Self-pay | Admitting: Family Medicine

## 2021-11-21 ENCOUNTER — Encounter (INDEPENDENT_AMBULATORY_CARE_PROVIDER_SITE_OTHER): Payer: Self-pay | Admitting: Family Medicine

## 2021-11-21 VITALS — BP 102/74 | HR 74 | Temp 98.7°F | Ht 61.0 in | Wt 200.0 lb

## 2021-11-21 DIAGNOSIS — Z0289 Encounter for other administrative examinations: Secondary | ICD-10-CM

## 2021-11-21 DIAGNOSIS — R7303 Prediabetes: Secondary | ICD-10-CM | POA: Insufficient documentation

## 2021-11-21 DIAGNOSIS — Z6837 Body mass index (BMI) 37.0-37.9, adult: Secondary | ICD-10-CM

## 2021-11-21 DIAGNOSIS — E669 Obesity, unspecified: Secondary | ICD-10-CM

## 2021-12-04 NOTE — Progress Notes (Signed)
Office: [phone]  /  Fax: [phone]   Initial Visit  Amy Lambert was seen in clinic today to evaluate for obesity. She is interested in losing weight to improve overall health and reduce the risk of weight related complications. She presents today to review program treatment options, initial physical assessment, and evaluation.     She was referred by: Specialist  When asked what else they would like to accomplish? She states: Improve existing medical conditions and Improve appearance, she felt good at 165 lbs.  When asked how has your weight affected you? She states: Other: increased back pain.   Some associated conditions: Prediabetes  Contributing factors: Medications, [id number]-2016-2017  Weight promoting medications identified: None  Current nutrition plan: Other: red meat, bread hurts her stomach, caffeine, GI upset.   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   Current or previous pharmacotherapy: None  Response to medication: Never tried medications  Past medical history includes:   Past Medical History:  Diagnosis Date   Anemia 12/13/2014   Colitis    Dysmenorrhea    History of kidney stones    Kidney infection    2X from UTI    Menorrhagia    UTI (lower urinary tract infection)    Objective:   BP 102/74   Pulse 74   Temp 98.7 F (37.1 C)   Ht 5\' 1"  (1.549 m)   Wt 200 lb (90.7 kg)   SpO2 97%   BMI 37.79 kg/m  She was weighed on the bioimpedance scale: Body mass index is 37.79 kg/m. Visceral Fat Rating: 9, Body Fat%:42.8  General:  Alert, oriented and cooperative. Patient is in no acute distress.  Respiratory: Normal respiratory effort, no problems with respiration noted  Extremities: Normal range of motion.    Mental Status: Normal mood and affect. Normal behavior. Normal judgment and thought content.   Assessment and Plan:  1. Pre-diabetes Last A1c was 5.9.  She has a  positive family history of T2DM.  She has an increase in abdominal adiposity.   Look for resolution of prediabetes with lifestyle changes and weight loss.   2. Obesity,current BMI 37.8 1) Reviewed obesity as a disease. 2) Reviewed information about our program. 3) Reviewed today's Bioimpedance results.  4) Reviewed her diagnosis of prediabetes and explained insulin resistance. 5) Recommend logging on my fitness pal in preparation for next visit.   We reviewed weight, biometrics, associated medical conditions and contributing factors with patient. She would benefit from weight loss therapy via a modified calorie, low-carb, high-protein nutritional plan tailored to their REE (resting energy expenditure) which will be determined by indirect calorimetry.  We will also assess for cardiometabolic risk and nutritional derangements via fasting serologies at her next appointment.     Obesity Treatment / Action Plan:  Will be scheduled for indirect calorimetry to determine resting energy expenditure in a fasting state.  This will allow to create a reduced calorie, high-protein meal plan to promote loss of fat mass while preserving muscle mass. Was counseled on pharmacotherapy and role as an adjunct in weight management.   Obesity Education Performed Today:  She was weighed on the bioimpedance scale and results were discussed and documented in the synopsis.  We discussed obesity as a disease and the importance of a more detailed evaluation of all the factors contributing to the disease.  We discussed the importance of long term lifestyle changes which include nutrition, exercise and behavioral modifications as  well as the importance of customizing this to her specific health and social needs.  We discussed the benefits of reaching a healthier weight to alleviate the symptoms of existing conditions and reduce the risks of the biomechanical, metabolic and psychological effects of obesity.  Amy Lambert appears to be in the action stage of change and states they are ready to start intensive lifestyle modifications and behavioral modifications.  30 minutes was spent today on this visit including the above counseling, pre-visit chart review, and post-visit documentation.  Reviewed by clinician on day of visit: allergies, medications, problem list, medical history, surgical history, family history, social history, and previous encounter notes.  I, Malcolm Metro, am acting as Energy manager for Seymour Bars, DO.   I have reviewed the above documentation for accuracy and completeness, and I agree with the above. Glennis Brink, DO

## 2021-12-10 ENCOUNTER — Ambulatory Visit (INDEPENDENT_AMBULATORY_CARE_PROVIDER_SITE_OTHER): Payer: Self-pay | Admitting: Family Medicine

## 2021-12-10 ENCOUNTER — Encounter (INDEPENDENT_AMBULATORY_CARE_PROVIDER_SITE_OTHER): Payer: Self-pay | Admitting: Family Medicine

## 2021-12-10 VITALS — BP 111/61 | HR 80 | Temp 98.8°F | Ht 61.0 in | Wt 201.0 lb

## 2021-12-10 DIAGNOSIS — Z6838 Body mass index (BMI) 38.0-38.9, adult: Secondary | ICD-10-CM

## 2021-12-10 DIAGNOSIS — R0683 Snoring: Secondary | ICD-10-CM

## 2021-12-10 DIAGNOSIS — K5909 Other constipation: Secondary | ICD-10-CM

## 2021-12-10 DIAGNOSIS — R7303 Prediabetes: Secondary | ICD-10-CM

## 2021-12-10 DIAGNOSIS — R0602 Shortness of breath: Secondary | ICD-10-CM

## 2021-12-10 DIAGNOSIS — R5383 Other fatigue: Secondary | ICD-10-CM | POA: Insufficient documentation

## 2021-12-10 DIAGNOSIS — E669 Obesity, unspecified: Secondary | ICD-10-CM

## 2021-12-10 DIAGNOSIS — Z1331 Encounter for screening for depression: Secondary | ICD-10-CM

## 2021-12-11 LAB — CBC WITH DIFFERENTIAL/PLATELET
Basophils Absolute: 0 10*3/uL (ref 0.0–0.2)
Basos: 0 %
EOS (ABSOLUTE): 0.2 10*3/uL (ref 0.0–0.4)
Eos: 3 %
Hematocrit: 35.2 % (ref 34.0–46.6)
Hemoglobin: 11.1 g/dL (ref 11.1–15.9)
Immature Grans (Abs): 0 10*3/uL (ref 0.0–0.1)
Immature Granulocytes: 0 %
Lymphocytes Absolute: 2.8 10*3/uL (ref 0.7–3.1)
Lymphs: 40 %
MCH: 27.5 pg (ref 26.6–33.0)
MCHC: 31.5 g/dL (ref 31.5–35.7)
MCV: 87 fL (ref 79–97)
Monocytes Absolute: 0.4 10*3/uL (ref 0.1–0.9)
Monocytes: 6 %
Neutrophils Absolute: 3.5 10*3/uL (ref 1.4–7.0)
Neutrophils: 51 %
Platelets: 357 10*3/uL (ref 150–450)
RBC: 4.04 x10E6/uL (ref 3.77–5.28)
RDW: 14.6 % (ref 11.7–15.4)
WBC: 6.9 10*3/uL (ref 3.4–10.8)

## 2021-12-11 LAB — COMPREHENSIVE METABOLIC PANEL
ALT: 11 IU/L (ref 0–32)
AST: 13 IU/L (ref 0–40)
Albumin/Globulin Ratio: 1.3 (ref 1.2–2.2)
Albumin: 4.3 g/dL (ref 4.0–5.0)
Alkaline Phosphatase: 63 IU/L (ref 44–121)
BUN/Creatinine Ratio: 12 (ref 9–23)
BUN: 9 mg/dL (ref 6–20)
Bilirubin Total: <0.2 mg/dL (ref 0.0–1.2)
CO2: 22 mmol/L (ref 20–29)
Calcium: 8.9 mg/dL (ref 8.7–10.2)
Chloride: 105 mmol/L (ref 96–106)
Creatinine, Ser: 0.73 mg/dL (ref 0.57–1.00)
Globulin, Total: 3.2 g/dL (ref 1.5–4.5)
Glucose: 84 mg/dL (ref 70–99)
Potassium: 4.3 mmol/L (ref 3.5–5.2)
Sodium: 140 mmol/L (ref 134–144)
Total Protein: 7.5 g/dL (ref 6.0–8.5)
eGFR: 116 mL/min/{1.73_m2} (ref >59)

## 2021-12-11 LAB — TSH: TSH: 2.02 u[IU]/mL (ref 0.450–4.500)

## 2021-12-11 LAB — VITAMIN D 25 HYDROXY (VIT D DEFICIENCY, FRACTURES): Vit D, 25-Hydroxy: 19.5 ng/mL — ABNORMAL LOW (ref 30.0–100.0)

## 2021-12-11 LAB — HEMOGLOBIN A1C
Est. average glucose Bld gHb Est-mCnc: 114 mg/dL
Hgb A1c MFr Bld: 5.6 % (ref 4.8–5.6)

## 2021-12-11 LAB — INSULIN, RANDOM: INSULIN: 12.1 u[IU]/mL (ref 2.6–24.9)

## 2021-12-11 LAB — VITAMIN B12: Vitamin B-12: 514 pg/mL (ref 232–1245)

## 2021-12-11 LAB — T4, FREE: Free T4: 1.25 ng/dL (ref 0.82–1.77)

## 2021-12-11 LAB — FOLATE: Folate: 8.6 ng/mL (ref >3.0)

## 2021-12-24 ENCOUNTER — Encounter (INDEPENDENT_AMBULATORY_CARE_PROVIDER_SITE_OTHER): Payer: Self-pay | Admitting: Family Medicine

## 2021-12-24 ENCOUNTER — Ambulatory Visit (INDEPENDENT_AMBULATORY_CARE_PROVIDER_SITE_OTHER): Payer: Self-pay | Admitting: Family Medicine

## 2021-12-24 VITALS — BP 111/79 | HR 87 | Temp 98.5°F | Ht 61.0 in | Wt 200.0 lb

## 2021-12-24 DIAGNOSIS — E669 Obesity, unspecified: Secondary | ICD-10-CM

## 2021-12-24 DIAGNOSIS — Z6837 Body mass index (BMI) 37.0-37.9, adult: Secondary | ICD-10-CM

## 2021-12-24 DIAGNOSIS — E559 Vitamin D deficiency, unspecified: Secondary | ICD-10-CM

## 2021-12-24 MED ORDER — LOMAIRA 8 MG PO TABS: ORAL_TABLET | ORAL | 0 refills | Status: DC

## 2021-12-24 MED ORDER — VITAMIN D (ERGOCALCIFEROL) 1.25 MG (50000 UNIT) PO CAPS: 50000.0000 [IU] | ORAL_CAPSULE | ORAL | 0 refills | Status: DC

## 2021-12-25 NOTE — Progress Notes (Signed)
Chief Complaint:   OBESITY Amy Lambert (MR# [id number]) is a 26 y.o. female who presents for evaluation and treatment of obesity and related comorbidities. Current BMI is Body mass index is 37.98 kg/m. Amy Lambert has been struggling with her weight for many years and has been unsuccessful in either losing weight, maintaining weight loss, or reaching her healthy weight goal.  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  Loves with her parents who are supportive.  She skips breakfast and rarely snacks.  She drinks sweet tea, juice, smoothies, she likes fried foods.  Denies big portions but has binged in the past. She has IUD for birth control.   Amy Lambert is currently in the action stage of change and ready to dedicate time achieving and maintaining a healthier weight. Amy Lambert is interested in becoming our patient and working on intensive lifestyle modifications including (but not limited to) diet and exercise for weight loss.  Amy Lambert's habits were reviewed today and are as follows: Her family eats meals together, she thinks her family will eat healthier with her, her desired weight loss is 36 lbs, she started gaining weight 2 years ago, her heaviest weight ever was 205 pounds, she skips meals frequently, she is frequently drinking liquids with calories, she frequently makes poor food choices, and she frequently eats larger portions than normal.  Depression Screen Amy Lambert's Food and Mood (modified PHQ-9) score was 3.  Subjective:   1. Other fatigue Amy Lambert admits to daytime somnolence and admits to waking up still tired. Patient has a history of symptoms of morning headache. Amy Lambert generally gets 8 hours of sleep per night, and states that she has generally restful sleep. Snoring is present. Apneic episodes are present. Epworth Sleepiness Score is 11. Reviewed EKG and IC today.   2. SOBOE (shortness of breath on exertion) Amy Lambert notes increasing shortness of breath with exercising and seems to be worsening  over time with weight gain. She notes getting out of breath sooner with activity than she used to. This has not gotten worse recently. Amy Lambert denies shortness of breath at rest or orthopnea.  3. Pre-diabetes Positive family history of T2DM.  On 07/12/21.  A1c 5.9. Does consume excess SSB's. Does consume excess SSB's.    4. Snoring Epworth score 11.  Denies daytime somnolence.  Rarely has AM headaches.   5. Chronic constipation Takes magnesium as needed for chronic constipation.  Drinks about 80 oz of water, fruits and veggies.   Assessment/Plan:   1. Other fatigue Amy Lambert does feel that her weight is causing her energy to be lower than it should be. Fatigue may be related to obesity, depression or many other causes. Labs will be ordered, and in the meanwhile, Amy Lambert will focus on self care including making healthy food choices, increasing physical activity and focusing on stress reduction. Update labs today.   - EKG 12-Lead - VITAMIN D 25 Hydroxy (Vit-D Deficiency, Fractures) - TSH - T4, free - Insulin, random - Hemoglobin A1c - Folate - Comprehensive metabolic panel - Vitamin B12 - CBC with Differential/Platelet  2. SOBOE (shortness of breath on exertion) [name] does feel that she gets out of breath more easily that she used to when she exercises. Amy Lambert's shortness of breath appears to be obesity related and exercise induced. She has agreed to work on weight loss and gradually increase exercise to treat her exercise induced shortness of breath. Will continue to monitor closely.  3. Pre-diabetes Discussed alternatives to SSB's.   Read labels for added sugar limiting  products with >8 gram of sugar per serving.   4. Snoring Consider polysomnogram if snoring continues.    5. Chronic constipation Look for improvements with dietary changes and increased walking time.  6. Depression screen Amy Lambert had a negative depression screening. Depression is commonly associated with obesity and  often results in emotional eating behaviors. We will monitor this closely and work on CBT to help improve the non-hunger eating patterns. Referral to Psychology may be required if no improvement is seen as she continues in our clinic.  7. Obesity, current BMI 38.1 1) Recommend sugar free electrolyte drink if drinking >80 oz of water daily.  2) Stop skipping breakfast 3) Add a 20-30 minutes walk at lunch.   Amy Lambert is currently in the action stage of change and her goal is to continue with weight loss efforts. I recommend Amy Lambert begin the structured treatment plan as follows:  She has agreed to the Category 2 Plan.  Exercise goals:  As is.     Behavioral modification strategies: increasing lean protein intake, increasing vegetables, increasing water intake, decreasing liquid calories, decreasing eating out, no skipping meals, meal planning and cooking strategies, keeping healthy foods in the home, and better snacking choices.  She was informed of the importance of frequent follow-up visits to maximize her success with intensive lifestyle modifications for her multiple health conditions. She was informed we would discuss her lab results at her next visit unless there is a critical issue that needs to be addressed sooner. Amy Lambert agreed to keep her next visit at the agreed upon time to discuss these results.  Objective:   Blood pressure 111/61, pulse 80, temperature 98.8 F (37.1 C), height 5\' 1"  (1.549 m), weight 201 lb (91.2 kg), SpO2 98 %. Body mass index is 37.98 kg/m.  EKG: Normal sinus rhythm, rate 78 BPM.  Indirect Calorimeter completed today shows a VO2 of 229 and a REE of 1584.  Her calculated basal metabolic rate is thus her basal metabolic rate is worse than expected.  General: Cooperative, alert, well developed, in no acute distress. HEENT: Conjunctivae and lids unremarkable. Cardiovascular: Regular rhythm.  Lungs: Normal work of breathing. Neurologic: No focal deficits.    Lab Results  Component Value Date   CREATININE 0.73 12/10/2021   BUN 9 12/10/2021   NA 140 12/10/2021   K 4.3 12/10/2021   CL 105 12/10/2021   CO2 22 12/10/2021   Lab Results  Component Value Date   ALT 11 12/10/2021   AST 13 12/10/2021   ALKPHOS 63 12/10/2021   BILITOT <0.2 12/10/2021   Lab Results  Component Value Date   HGBA1C 5.6 12/10/2021   HGBA1C 5.9 07/12/2021   Lab Results  Component Value Date   INSULIN 12.1 12/10/2021   Lab Results  Component Value Date   TSH 2.020 12/10/2021   Lab Results  Component Value Date   CHOL 149 07/12/2021   HDL 52.60 07/12/2021   LDLCALC 86 07/12/2021   TRIG 52.0 07/12/2021   CHOLHDL 3 07/12/2021   Lab Results  Component Value Date   WBC 6.9 12/10/2021   HGB 11.1 12/10/2021   HCT 35.2 12/10/2021   MCV 87 12/10/2021   PLT 357 12/10/2021   No results found for: "IRON", "TIBC", "FERRITIN"  Attestation Statements:   Reviewed by clinician on day of visit: allergies, medications, problem list, medical history, surgical history, family history, social history, and previous encounter notes.  I have personally spent 45 minutes total time today in preparation,  patient care, and documentation for this visit, including the following: review of clinical lab tests; review of medical tests/procedures/services.    I, Malcolm Metro, am acting as Energy manager for Seymour Bars, DO.  I have reviewed the above documentation for accuracy and completeness, and I agree with the above. Glennis Brink, DO

## 2022-01-01 NOTE — Progress Notes (Signed)
Chief Complaint:   OBESITY Amy Lambert is here to discuss her progress with her obesity treatment plan along with follow-up of her obesity related diagnoses. Amy Lambert is on the Category 2 Plan and states she is following her eating plan approximately 90% of the time. Amy Lambert states she is gym workouts 60 minutes 1-2 times per week.  Today's visit was #: 2 Starting weight: 201 lbs Starting date: 12/10/2021 Today's weight: 200 lbs Today's date: 12/24/2021 Total lbs lost to date: 1 lb Total lbs lost since last in-office visit: 1 lb  Interim History: Patient is getting in meals without meals given.  She is feeling full with meals.  Has had some menstrual food cravings but following a low calorie treat.  She is eating out 1-2 meals per week.  Good support at home.  She added in gym workouts.  She is frustrated by the lack of weight loss.  Subjective:   1. Vitamin D deficiency New diagnosis.  Discussed labs with patient today. Vitamin D level was 19.5 on 12/10/2021.  She is currently not on any supplements.  Assessment/Plan:   1. Vitamin D deficiency Recheck level in 4 months.  Begin- Vitamin D, Ergocalciferol, (DRISDOL) 1.25 MG (50000 UNIT) CAPS capsule; Take 1 capsule (50,000 Units total) by mouth every 7 (seven) days.  Dispense: 5 capsule; Refill: 0  2. Obesity,current BMI 37.9 Informed consent signed.  Patient has an IUD.  Review potential adverse side effects.  Begin- Phentermine HCl (LOMAIRA) 8 MG TABS; 1 tab po 30 min before breakfast and 1 tab po at 2 pm daily  Dispense: 60 tablet; Refill: 0   Amy Lambert is currently in the action stage of change. As such, her goal is to continue with weight loss efforts. She has agreed to the Category 2 Plan.   Exercise goals:  Increase gym to 3 days a week.  Behavioral modification strategies: increasing lean protein intake, increasing vegetables, increasing water intake, decreasing eating out, no skipping meals, meal planning and cooking  strategies, keeping healthy foods in the home, and holiday eating strategies .  Amy Lambert has agreed to follow-up with our clinic in 4 weeks. She was informed of the importance of frequent follow-up visits to maximize her success with intensive lifestyle modifications for her multiple health conditions.   Objective:   Blood pressure 111/79, pulse 87, temperature 98.5 F (36.9 C), height 5\' 1"  (1.549 m), weight 200 lb (90.7 kg), SpO2 94 %. Body mass index is 37.79 kg/m.  General: Cooperative, alert, well developed, in no acute distress. HEENT: Conjunctivae and lids unremarkable. Cardiovascular: Regular rhythm.  Lungs: Normal work of breathing. Neurologic: No focal deficits.   Lab Results  Component Value Date   CREATININE 0.73 12/10/2021   BUN 9 12/10/2021   NA 140 12/10/2021   K 4.3 12/10/2021   CL 105 12/10/2021   CO2 22 12/10/2021   Lab Results  Component Value Date   ALT 11 12/10/2021   AST 13 12/10/2021   ALKPHOS 63 12/10/2021   BILITOT <0.2 12/10/2021   Lab Results  Component Value Date   HGBA1C 5.6 12/10/2021   HGBA1C 5.9 07/12/2021   Lab Results  Component Value Date   INSULIN 12.1 12/10/2021   Lab Results  Component Value Date   TSH 2.020 12/10/2021   Lab Results  Component Value Date   CHOL 149 07/12/2021   HDL 52.60 07/12/2021   LDLCALC 86 07/12/2021   TRIG 52.0 07/12/2021   CHOLHDL 3 07/12/2021   Lab  Results  Component Value Date   VD25OH 19.5 (L) 12/10/2021   Lab Results  Component Value Date   WBC 6.9 12/10/2021   HGB 11.1 12/10/2021   HCT 35.2 12/10/2021   MCV 87 12/10/2021   PLT 357 12/10/2021   No results found for: "IRON", "TIBC", "FERRITIN"   Attestation Statements:   Reviewed by clinician on day of visit: allergies, medications, problem list, medical history, surgical history, family history, social history, and previous encounter notes.  I, Amy Lambert, am acting as Energy manager for Seymour Bars, DO.  I have reviewed the  above documentation for accuracy and completeness, and I agree with the above. Glennis Brink, DO

## 2022-01-21 ENCOUNTER — Other Ambulatory Visit (INDEPENDENT_AMBULATORY_CARE_PROVIDER_SITE_OTHER): Payer: Self-pay | Admitting: Family Medicine

## 2022-01-21 ENCOUNTER — Ambulatory Visit (INDEPENDENT_AMBULATORY_CARE_PROVIDER_SITE_OTHER): Payer: Self-pay | Admitting: Family Medicine

## 2022-01-21 DIAGNOSIS — E559 Vitamin D deficiency, unspecified: Secondary | ICD-10-CM

## 2022-05-01 ENCOUNTER — Ambulatory Visit: Payer: Managed Care, Other (non HMO) | Admitting: Nurse Practitioner

## 2022-05-01 ENCOUNTER — Encounter: Payer: Self-pay | Admitting: Nurse Practitioner

## 2022-05-01 VITALS — BP 124/76 | HR 91 | Temp 98.6°F | Ht 61.0 in | Wt 207.0 lb

## 2022-05-01 DIAGNOSIS — J0140 Acute pansinusitis, unspecified: Secondary | ICD-10-CM | POA: Diagnosis not present

## 2022-05-01 DIAGNOSIS — J3489 Other specified disorders of nose and nasal sinuses: Secondary | ICD-10-CM

## 2022-05-01 LAB — POC COVID19 BINAXNOW: SARS Coronavirus 2 Ag: NEGATIVE

## 2022-05-01 MED ORDER — AZITHROMYCIN 250 MG PO TABS: ORAL_TABLET | ORAL | 0 refills | Status: AC

## 2022-05-01 NOTE — Progress Notes (Signed)
Acute Office Visit  Subjective:     Patient ID: Amy Lambert, female    DOB: 07/18/1995, 27 y.o.   MRN: [id number]  Chief Complaint  Patient presents with   Nasal Congestion    Stuffy nose and popping in ears since Tuesday    HPI Patient is in today for sinus pressure and congestion for the last 3 days she states her allergies had been bothering her for a few weeks before this.   UPPER RESPIRATORY TRACT INFECTION  Fever: no Cough: no Shortness of breath: no Wheezing: no Chest pain: no Chest tightness: no Chest congestion: no Nasal congestion: yes Runny nose: yes Post nasal drip: no Sneezing: no Sore throat: no Swollen glands: no Sinus pressure: yes Headache: no Face pain: no Toothache: no Ear pain: no bilateral Ear pressure: yes bilateral Eyes red/itching:no Eye drainage/crusting: no  Vomiting: no Rash: no Fatigue: yes Sick contacts: no Strep contacts: no  Context: stable Recurrent sinusitis: no Relief with OTC cold/cough medications:  some   Treatments attempted: dayquil, nyquil    ROS See pertinent positives and negatives per HPI.     Objective:    BP 124/76 (BP Location: Right Arm)   Pulse 91   Temp 98.6 F (37 C)   Ht 5\' 1"  (1.549 m)   Wt 207 lb (93.9 kg)   SpO2 99%   BMI 39.11 kg/m    Physical Exam Vitals and nursing note reviewed.  Constitutional:      General: She is not in acute distress.    Appearance: Normal appearance.  HENT:     Head: Normocephalic.     Right Ear: Tympanic membrane, ear canal and external ear normal.     Left Ear: Tympanic membrane, ear canal and external ear normal.     Mouth/Throat:     Pharynx: Posterior oropharyngeal erythema present. No oropharyngeal exudate.  Eyes:     Conjunctiva/sclera: Conjunctivae normal.  Cardiovascular:     Rate and Rhythm: Normal rate and regular rhythm.     Pulses: Normal pulses.     Heart sounds: Normal heart sounds.  Pulmonary:     Effort: Pulmonary effort is normal.      Breath sounds: Normal breath sounds.  Musculoskeletal:     Cervical back: Normal range of motion.  Skin:    General: Skin is warm.  Neurological:     General: No focal deficit present.     Mental Status: She is alert and oriented to person, place, and time.  Psychiatric:        Mood and Affect: Mood normal.        Behavior: Behavior normal.        Thought Content: Thought content normal.        Judgment: Judgment normal.     Results for orders placed or performed in visit on 05/01/22  POC COVID-19 BinaxNow  Result Value Ref Range   SARS Coronavirus 2 Ag Negative Negative        Assessment & Plan:   Problem List Items Addressed This Visit   None Visit Diagnoses     Acute non-recurrent pansinusitis    -  Primary   Treat with z-pak for 5 days. Encourage fluids. Continue dayquil and flonase. F/U if not improving.   Relevant Medications   azithromycin (ZITHROMAX) 250 MG tablet   Sinus pressure       Covid negative. Will treat for sinus infection. Continue flonase and start nasal saline spray as neeed.   Relevant  Orders   POC COVID-19 BinaxNow (Completed)       Meds ordered this encounter  Medications   azithromycin (ZITHROMAX) 250 MG tablet    Sig: Take 2 tablets on day 1, then 1 tablet daily on days 2 through 5    Dispense:  6 tablet    Refill:  0    Return if symptoms worsen or fail to improve.  Gerre Scull, NP

## 2022-05-01 NOTE — Patient Instructions (Addendum)
It was great to see you!  Start zpak 2 pills today and then 1 pill daily for 4 days after.  You can keeping using flonase and dayquil and start nasal saline spray.   Ask your insurance if they cover zepbound or wegovy for weight loss.   Let's follow-up if your symptoms worsen or don't improve  Take care,  Rodman Pickle, NP

## 2022-05-06 LAB — BASIC METABOLIC PANEL
BUN: 11 (ref 4–21)
CO2: 25 — AB (ref 13–22)
Chloride: 107 (ref 99–108)
Creatinine: 0.8 (ref 0.5–1.1)
Glucose: 82
Potassium: 4.4 mEq/L (ref 3.5–5.1)
Sodium: 139 (ref 137–147)

## 2022-05-06 LAB — VITAMIN D 25 HYDROXY (VIT D DEFICIENCY, FRACTURES): Vit D, 25-Hydroxy: 16.1

## 2022-05-06 LAB — HEPATIC FUNCTION PANEL
ALT: 13 U/L (ref 7–35)
AST: 16 (ref 13–35)
Alkaline Phosphatase: 62 (ref 25–125)
Bilirubin, Total: 0.2

## 2022-05-06 LAB — HEMOGLOBIN A1C: Hemoglobin A1C: 5.6

## 2022-05-06 LAB — CBC AND DIFFERENTIAL
HCT: 37 (ref 36–46)
Hemoglobin: 11.9 — AB (ref 12.0–16.0)
Platelets: 404 10*3/uL — AB (ref 150–400)
WBC: 7

## 2022-05-06 LAB — COMPREHENSIVE METABOLIC PANEL
Albumin: 4.1 (ref 3.5–5.0)
Calcium: 9.1 (ref 8.7–10.7)
eGFR: 91

## 2022-05-06 LAB — LIPID PANEL
Cholesterol: 181 (ref 0–200)
HDL: 52 (ref 35–70)
LDL Cholesterol: 117
Triglycerides: 61 (ref 40–160)

## 2022-05-06 LAB — CBC: RBC: 4.23 (ref 3.87–5.11)

## 2022-05-06 LAB — IRON,TIBC AND FERRITIN PANEL
Ferritin: 25
Iron: 33

## 2022-05-06 LAB — TSH: TSH: 1.38 (ref 0.41–5.90)

## 2022-05-22 ENCOUNTER — Ambulatory Visit (INDEPENDENT_AMBULATORY_CARE_PROVIDER_SITE_OTHER): Payer: Managed Care, Other (non HMO) | Admitting: Internal Medicine

## 2022-05-22 ENCOUNTER — Encounter: Payer: Self-pay | Admitting: Internal Medicine

## 2022-05-22 VITALS — BP 118/82 | HR 80 | Temp 98.5°F | Ht 61.0 in | Wt 204.0 lb

## 2022-05-22 DIAGNOSIS — Z0000 Encounter for general adult medical examination without abnormal findings: Secondary | ICD-10-CM | POA: Diagnosis not present

## 2022-05-22 DIAGNOSIS — Z6838 Body mass index (BMI) 38.0-38.9, adult: Secondary | ICD-10-CM

## 2022-05-22 DIAGNOSIS — Z7689 Persons encountering health services in other specified circumstances: Secondary | ICD-10-CM

## 2022-05-22 DIAGNOSIS — L732 Hidradenitis suppurativa: Secondary | ICD-10-CM | POA: Diagnosis not present

## 2022-05-22 DIAGNOSIS — E6609 Other obesity due to excess calories: Secondary | ICD-10-CM

## 2022-05-22 DIAGNOSIS — E559 Vitamin D deficiency, unspecified: Secondary | ICD-10-CM | POA: Diagnosis not present

## 2022-05-22 MED ORDER — VITAMIN D (ERGOCALCIFEROL) 1.25 MG (50000 UNIT) PO CAPS: 50000.0000 [IU] | ORAL_CAPSULE | ORAL | 0 refills | Status: DC

## 2022-05-22 MED ORDER — ZEPBOUND 2.5 MG/0.5ML ~~LOC~~ SOAJ: 2.5000 mg | SUBCUTANEOUS | 0 refills | Status: DC

## 2022-05-22 NOTE — Progress Notes (Signed)
I,Amy Lambert,acting as a scribe for Amy Aliment, MD.,have documented all relevant documentation on the behalf of Amy Aliment, MD,as directed by  Amy Aliment, MD while in the presence of Amy Aliment, MD.   Subjective:     Patient ID: Amy Lambert , female    DOB: 18-May-1995 , 27 y.o.   MRN: [id number]   Chief Complaint  Patient presents with   Establish Care    HPI  Pt presents today to establish care. She was referred by her mother, T. D., who is also a patient of [facility]. She is followed by Dr. Estanislado Pandy for her GYN exams. She does report history of hidradenitis suppurativa. She has had axillary lesions in the past, now she is getting them beneath her breasts.  She is an AGGIE!  She is currently a Investment banker, corporate and plans to move to [employer] in the near future. She is currently in a committed relationship with her boyfriend, whom she eventually plans to marry. She is interested in having children.   She is currently followed by [facility] for weight management. She states there is a [place] location and plans to transfer there once she relocates. She is interested in pharmacologic management of obesity. She denies family history of thyroid cancer.  She has been trying to make lifestyle changes: going to the gym & changing her diet. She feels that she has not made much progress thus far.        Past Medical History:  Diagnosis Date   Anemia 12/13/2014   Colitis    Dysmenorrhea    History of kidney stones    IBS (irritable bowel syndrome)    constipation   Kidney infection    2X from UTI    Menorrhagia    Pre-diabetes    UTI (lower urinary tract infection)    Vitamin D deficiency      Family History  Problem Relation Age of Onset   Migraines Mother    Diabetes Father    Diabetes Maternal Grandmother    Hypertension Maternal Grandmother    Heart attack Maternal Grandmother    Hypertension Paternal Grandmother      Current Outpatient Medications:     levonorgestrel (KYLEENA) 19.5 MG IUD, , Disp: , Rfl:    naproxen (NAPROSYN) 250 MG tablet, Take by mouth 2 (two) times daily with a meal., Disp: , Rfl:    tirzepatide (ZEPBOUND) 2.5 MG/0.5ML Pen, Inject 2.5 mg into the skin once a week., Disp: 2 mL, Rfl: 0   fluticasone (FLONASE) 50 MCG/ACT nasal spray, Place 2 sprays into both nostrils daily for 7 days., Disp: 1 g, Rfl: 0   Phentermine HCl (LOMAIRA) 8 MG TABS, 1 tab po 30 min before breakfast and 1 tab po at 2 pm daily (Patient not taking: Reported on 05/01/2022), Disp: 60 tablet, Rfl: 0   Vitamin D, Ergocalciferol, (DRISDOL) 1.25 MG (50000 UNIT) CAPS capsule, Take 1 capsule (50,000 Units total) by mouth every 7 (seven) days., Disp: 8 capsule, Rfl: 0   Allergies  Allergen Reactions   Penicillins Hives    & fever & fever      The patient states she uses IUD for birth control. Last LMP was Patient's last menstrual period was 05/07/2022 (exact date).. Negative for Dysmenorrhea. Negative for: breast discharge, breast lump(s), breast pain and breast self exam. Associated symptoms include abnormal vaginal bleeding. Pertinent negatives include abnormal bleeding (hematology), anxiety, decreased libido, depression, difficulty falling sleep, dyspareunia, history of infertility, nocturia,  sexual dysfunction, sleep disturbances, urinary incontinence, urinary urgency, vaginal discharge and vaginal itching. Diet regular.The patient states her exercise level is  moderate.  . The patient's tobacco use is:  Social History   Tobacco Use  Smoking Status Never  Smokeless Tobacco Never  . She has been exposed to passive smoke. The patient's alcohol use is:  Social History   Substance and Sexual Activity  Alcohol Use Yes   Comment: occ    Review of Systems  Constitutional: Negative.   HENT: Negative.    Eyes: Negative.   Respiratory: Negative.    Cardiovascular: Negative.   Gastrointestinal: Negative.   Endocrine: Negative.   Genitourinary: Negative.    Musculoskeletal: Negative.   Skin: Negative.   Allergic/Immunologic: Negative.   Hematological: Negative.   Psychiatric/Behavioral: Negative.       Today's Vitals   05/22/22 1452  BP: 118/82  Pulse: 80  Temp: 98.5 F (36.9 C)  SpO2: 98%  Weight: 204 lb (92.5 kg)  Height: 5\' 1"  (1.549 m)   Body mass index is 38.55 kg/m.  Wt Readings from Last 3 Encounters:  05/22/22 204 lb (92.5 kg)  05/01/22 207 lb (93.9 kg)  12/24/21 200 lb (90.7 kg)    Objective:  Physical Exam Vitals and nursing note reviewed.  Constitutional:      Appearance: Normal appearance. She is obese.  HENT:     Head: Normocephalic and atraumatic.     Right Ear: Tympanic membrane, ear canal and external ear normal.     Left Ear: Tympanic membrane, ear canal and external ear normal.     Nose: Nose normal.     Mouth/Throat:     Mouth: Mucous membranes are moist.     Pharynx: Oropharynx is clear.     Comments: Tonsillar enlargement Eyes:     Extraocular Movements: Extraocular movements intact.     Conjunctiva/sclera: Conjunctivae normal.     Pupils: Pupils are equal, round, and reactive to light.  Cardiovascular:     Rate and Rhythm: Normal rate and regular rhythm.     Pulses: Normal pulses.     Heart sounds: Normal heart sounds.  Pulmonary:     Effort: Pulmonary effort is normal.     Breath sounds: Normal breath sounds.  Chest:  Breasts:    Tanner Score is 5.     Right: Normal.     Left: Normal.  Abdominal:     General: Bowel sounds are normal.     Palpations: Abdomen is soft.     Comments: Umbilical nodule  Genitourinary:    Comments: deferred Musculoskeletal:        General: Normal range of motion.     Cervical back: Normal range of motion and neck supple.  Skin:    General: Skin is warm and dry.  Neurological:     General: No focal deficit present.     Mental Status: She is alert and oriented to person, place, and time.  Psychiatric:        Mood and Affect: Mood normal.         Behavior: Behavior normal.      Assessment And Plan:     1. Routine general medical examination at health care facility Comments: A full CPE was performed, except for GYN exam. She is encouraged to perform monthly self breast exams. Labs from [place] reviewed in full detail. I will have the results abstracted into her chart. Of note, she has elevated leptin levels, slightly elevated chloride, low vitamin  D  and LDL 117.  PATIENT IS ADVISED TO GET 30-45 MINUTES REGULAR EXERCISE NO LESS THAN FOUR TO FIVE DAYS PER WEEK - BOTH WEIGHTBEARING EXERCISES AND AEROBIC ARE RECOMMENDED.  PATIENT IS ADVISED TO FOLLOW A HEALTHY DIET WITH AT LEAST SIX FRUITS/VEGGIES PER DAY, DECREASE INTAKE OF RED MEAT, AND TO INCREASE FISH INTAKE TO TWO DAYS PER WEEK.  MEATS/FISH SHOULD NOT BE FRIED, BAKED OR BROILED IS PREFERABLE.  IT IS ALSO IMPORTANT TO CUT BACK ON YOUR SUGAR INTAKE. PLEASE AVOID ANYTHING WITH ADDED SUGAR, CORN SYRUP OR OTHER SWEETENERS. IF YOU MUST USE A SWEETENER, YOU CAN TRY STEVIA. IT IS ALSO IMPORTANT TO AVOID ARTIFICIALLY SWEETENERS AND DIET BEVERAGES. LASTLY, I SUGGEST WEARING SPF 50 SUNSCREEN ON EXPOSED PARTS AND ESPECIALLY WHEN IN THE DIRECT SUNLIGHT FOR AN EXTENDED PERIOD OF TIME.  PLEASE AVOID FAST FOOD RESTAURANTS AND INCREASE YOUR WATER INTAKE.  2. Hidradenitis suppurativa Comments: She agrees with Derm referral for further evaluation. - Ambulatory referral to Dermatology  3. Vitamin D deficiency Comments: I will send rx vitamin D to be taken weekly. Once she has completed rx, I plan to transition her to 2000-5000 units vitamin D3 capsules daily. - Vitamin D, Ergocalciferol, (DRISDOL) 1.25 MG (50000 UNIT) CAPS capsule; Take 1 capsule (50,000 Units total) by mouth every 7 (seven) days.  Dispense: 8 capsule; Refill: 0  4. Class 2 obesity due to excess calories without serious comorbidity with body mass index (BMI) of 38.0 to 38.9 in adult Comments: We discussed the use of Zepbound to address  obesity.  She denies family h/o thyroid cancer.  She has not met goal of at least 5% of body weight loss with comprehensive lifestyle modifications alone in the past 3-6 months. Pharmacotherapy is appropriate to pursue as augmentation. Will send prescription for Zepbound.  She understands concerns for product shortages and the concerns of starting/stopping in relation to GI distress.  Advised patient on common side effects including nausea, diarrhea, dyspepsia, decreased appetite, and fatigue. Counseled patient on reducing meal size and how to titrate medication to minimize side effects. Patient aware to call if intolerable side effects or if experiencing dehydration, abdominal pain, or dizziness. Patient will adhere to dietary modifications and will target at least 150 minutes of moderate intensity exercise weekly.  Once approved, she agrees to come in for pen teaching to learn how to self administer the medication. Once started, she will rto in six weeks for re-evaluation.   5. Encounter to establish care  Patient was given opportunity to ask questions. Patient verbalized understanding of the plan and was able to repeat key elements of the plan. All questions were answered to their satisfaction.   I, Amy Aliment, MD, have reviewed all documentation for this visit. The documentation on 05/22/22 for the exam, diagnosis, procedures, and orders are all accurate and complete.   THE PATIENT IS ENCOURAGED TO PRACTICE SOCIAL DISTANCING DUE TO THE COVID-19 PANDEMIC.

## 2022-05-22 NOTE — Patient Instructions (Signed)

## 2022-05-23 ENCOUNTER — Encounter: Payer: Self-pay | Admitting: Internal Medicine

## 2022-05-27 ENCOUNTER — Encounter: Payer: Self-pay | Admitting: Internal Medicine

## 2022-07-07 ENCOUNTER — Other Ambulatory Visit: Payer: Self-pay

## 2022-07-07 ENCOUNTER — Emergency Department (HOSPITAL_BASED_OUTPATIENT_CLINIC_OR_DEPARTMENT_OTHER): Payer: Managed Care, Other (non HMO)

## 2022-07-07 ENCOUNTER — Telehealth: Payer: Self-pay

## 2022-07-07 ENCOUNTER — Emergency Department (HOSPITAL_BASED_OUTPATIENT_CLINIC_OR_DEPARTMENT_OTHER)
Admission: EM | Admit: 2022-07-07 | Discharge: 2022-07-07 | Disposition: A | Discharge status: Home / Self Care | Payer: Managed Care, Other (non HMO) | Attending: Emergency Medicine | Admitting: Emergency Medicine

## 2022-07-07 ENCOUNTER — Encounter (HOSPITAL_BASED_OUTPATIENT_CLINIC_OR_DEPARTMENT_OTHER): Payer: Self-pay | Admitting: Emergency Medicine

## 2022-07-07 DIAGNOSIS — R1030 Lower abdominal pain, unspecified: Secondary | ICD-10-CM | POA: Insufficient documentation

## 2022-07-07 LAB — COMPREHENSIVE METABOLIC PANEL
ALT: 13 U/L (ref 0–44)
AST: 13 U/L — ABNORMAL LOW (ref 15–41)
Albumin: 3.5 g/dL (ref 3.5–5.0)
Alkaline Phosphatase: 51 U/L (ref 38–126)
Anion gap: 7 (ref 5–15)
BUN: 12 mg/dL (ref 6–20)
CO2: 26 mmol/L (ref 22–32)
Calcium: 8.3 mg/dL — ABNORMAL LOW (ref 8.9–10.3)
Chloride: 104 mmol/L (ref 98–111)
Creatinine, Ser: 0.8 mg/dL (ref 0.44–1.00)
GFR, Estimated: >60 mL/min (ref >60)
Glucose, Bld: 107 mg/dL — ABNORMAL HIGH (ref 70–99)
Potassium: 3.4 mmol/L — ABNORMAL LOW (ref 3.5–5.1)
Sodium: 137 mmol/L (ref 135–145)
Total Bilirubin: 0.2 mg/dL — ABNORMAL LOW (ref 0.3–1.2)
Total Protein: 7.6 g/dL (ref 6.5–8.1)

## 2022-07-07 LAB — LIPASE, BLOOD: Lipase: 57 U/L — ABNORMAL HIGH (ref 11–51)

## 2022-07-07 LAB — CBC
HCT: 35.3 % — ABNORMAL LOW (ref 36.0–46.0)
Hemoglobin: 11.3 g/dL — ABNORMAL LOW (ref 12.0–15.0)
MCH: 27.7 pg (ref 26.0–34.0)
MCHC: 32 g/dL (ref 30.0–36.0)
MCV: 86.5 fL (ref 80.0–100.0)
Platelets: 379 10*3/uL (ref 150–400)
RBC: 4.08 MIL/uL (ref 3.87–5.11)
RDW: 15.4 % (ref 11.5–15.5)
WBC: 8.6 10*3/uL (ref 4.0–10.5)
nRBC: 0 % (ref 0.0–0.2)

## 2022-07-07 LAB — URINALYSIS, ROUTINE W REFLEX MICROSCOPIC
Bilirubin Urine: NEGATIVE
Glucose, UA: NEGATIVE mg/dL
Hgb urine dipstick: NEGATIVE
Ketones, ur: NEGATIVE mg/dL
Leukocytes,Ua: NEGATIVE
Nitrite: NEGATIVE
Protein, ur: NEGATIVE mg/dL
Specific Gravity, Urine: >=1.03 (ref 1.005–1.030)
pH: 6.5 (ref 5.0–8.0)

## 2022-07-07 LAB — PREGNANCY, URINE: Preg Test, Ur: NEGATIVE

## 2022-07-07 MED ORDER — IOHEXOL 300 MG/ML  SOLN: 100.0000 mL | Freq: Once | INTRAMUSCULAR | Status: DC | PRN

## 2022-07-07 MED ORDER — IOHEXOL 300 MG/ML  SOLN
80.0000 mL | Freq: Once | INTRAMUSCULAR | Status: AC | PRN
  Administered 2022-07-07: 80 mL via INTRAVENOUS

## 2022-07-07 NOTE — ED Notes (Signed)
Pt states that symptoms include hot flashes and a slight stomach ache two days ago.  No other symptoms at this time but "w/ my history I need to have things checked out."

## 2022-07-07 NOTE — Discharge Instructions (Signed)
Take ibuprofen 600 mg every 6 hours as needed for pain.  Rest.  Return to the ER if your symptoms significantly worsen or change. 

## 2022-07-07 NOTE — ED Triage Notes (Signed)
Lower abd pain and urinary frequency since last Tuesday. She states last Tuesday she had diarrhea that resolved but states  " I haven't felt like herself since". She states she had a colon abscess in the past that felt similar to this. (10/2020). Afebrile.

## 2022-07-07 NOTE — Telephone Encounter (Signed)
Please schedule her for ER follow up with Pat. Thx   Left pt vm to call the office [facility]

## 2022-07-07 NOTE — ED Provider Notes (Signed)
[place] [facility] Provider Note   CSN: [id number] Arrival date & time: 07/07/22  0017     History  Chief Complaint  Patient presents with   Abdominal Pain    Amy Lambert is a 27 y.o. female.  Patient is a 27 year old female with history of prior UTIs and prior colonic abscess.  Patient presenting today with complaints of bodyaches, chills, and lower abdominal discomfort for the past several days.  She describes some urinary frequency, but no dysuria.  This feels similar to what she experienced when she was diagnosed with a colonic abscess in the past.  No aggravating or alleviating factors.  The history is provided by the patient.       Home Medications Prior to Admission medications   Medication Sig Start Date End Date Taking? Authorizing Provider  fluticasone (FLONASE) 50 MCG/ACT nasal spray Place 2 sprays into both nostrils daily for 7 days. 10/30/21 11/06/21  Long, Arlyss Repress, MD  levonorgestrel [facility]) 19.5 MG IUD     [provider]  naproxen (NAPROSYN) 250 MG tablet Take by mouth 2 (two) times daily with a meal.    [provider]  tirzepatide (ZEPBOUND) 2.5 MG/0.5ML Pen Inject 2.5 mg into the skin once a week. 05/22/22   Dorothyann Peng, MD  Vitamin D, Ergocalciferol, (DRISDOL) 1.25 MG (50000 UNIT) CAPS capsule Take 1 capsule (50,000 Units total) by mouth every 7 (seven) days. 05/22/22   Dorothyann Peng, MD      Allergies    Penicillins    Review of Systems   Review of Systems  All other systems reviewed and are negative.   Physical Exam Updated Vital Signs BP 123/77 (BP Location: Right Arm)   Pulse 70   Resp 20   Ht 5\' 1"  (1.549 m)   Wt 83.9 kg   LMP 07/02/2022   SpO2 98%   BMI 34.96 kg/m  Physical Exam Vitals and nursing note reviewed.  Constitutional:      General: She is not in acute distress.    Appearance: She is well-developed. She is not diaphoretic.  HENT:     Head: Normocephalic and  atraumatic.  Cardiovascular:     Rate and Rhythm: Normal rate and regular rhythm.     Heart sounds: No murmur heard.    No friction rub. No gallop.  Pulmonary:     Effort: Pulmonary effort is normal. No respiratory distress.     Breath sounds: Normal breath sounds. No wheezing.  Abdominal:     General: Bowel sounds are normal. There is no distension.     Palpations: Abdomen is soft.     Tenderness: There is abdominal tenderness in the right lower quadrant, suprapubic area and left lower quadrant.     Comments: There is mild tenderness across the lower abdomen, but no peritoneal signs.  Musculoskeletal:        General: Normal range of motion.     Cervical back: Normal range of motion and neck supple.  Skin:    General: Skin is warm and dry.  Neurological:     General: No focal deficit present.     Mental Status: She is alert and oriented to person, place, and time.     ED Results / Procedures / Treatments   Labs (all labs ordered are listed, but only abnormal results are displayed) Labs Reviewed  LIPASE, BLOOD  COMPREHENSIVE METABOLIC PANEL  CBC  URINALYSIS, ROUTINE W REFLEX MICROSCOPIC  PREGNANCY, URINE  EKG None  Radiology No results found.  Procedures Procedures    Medications Ordered in ED Medications - No data to display  ED Course/ Medical Decision Making/ A&P  Patient is a 27 year old female presenting with abdominal discomfort as described in the HPI.  She arrives here with stable vital signs and is afebrile.  Physical examination reveals mild tenderness across the lower abdomen, but no peritoneal signs.  Workup initiated including CBC, metabolic panel, urinalysis, and U pregnancy.  All studies unremarkable.  CT scan of the abdomen and pelvis shows no acute intra-abdominal pathology.  There is some soft tissue thickening in the region of the umbilicus, however this has been investigated and thought to be endometrial tissue by her GYN.  She is scheduled  for surgery to remove this in the future.  This does not need emergent workup and I do not feel is the cause of her discomfort.  At this point, I feel as though patient can safely be discharged.  She was concerned that the colonic abscess she experienced in the past is returning.  She has no white blood cell count and no evidence of this on CT scan, so I feel patient can be discharged with expectant return.  Final Clinical Impression(s) / ED Diagnoses Final diagnoses:  None    Rx / DC Orders ED Discharge Orders     None         Geoffery Lyons, MD 07/07/22 0206

## 2022-07-17 ENCOUNTER — Ambulatory Visit: Payer: Managed Care, Other (non HMO) | Admitting: Internal Medicine

## 2022-07-29 ENCOUNTER — Ambulatory Visit: Payer: Managed Care, Other (non HMO) | Admitting: Internal Medicine

## 2023-03-25 ENCOUNTER — Other Ambulatory Visit: Payer: Self-pay | Admitting: Internal Medicine

## 2023-03-25 MED ORDER — ZEPBOUND 2.5 MG/0.5ML ~~LOC~~ SOAJ: 2.5000 mg | SUBCUTANEOUS | 0 refills | Status: DC

## 2023-03-25 NOTE — Telephone Encounter (Signed)
 Copied from CRM [id number]. Topic: Clinical - Medication Refill >> Mar 25, 2023  3:44 PM Franchot Heidelberg wrote: Most Recent Primary Care Visit:  Provider: Dorothyann Peng  Department: [facility]  Visit Type: NEW PATIENT  Date: 05/22/2022  Medication: tirzepatide (ZEPBOUND) 2.5 MG/0.5ML Pen  Has the patient contacted their pharmacy? Yes (Agent: If no, request that the patient contact the pharmacy for the refill. If patient does not wish to contact the pharmacy document the reason why and proceed with request.) (Agent: If yes, when and what did the pharmacy advise?)  Is this the correct pharmacy for this prescription? Yes If no, delete pharmacy and type the correct one.  This is the patient's preferred pharmacy:   [facility] [address] [place] [phone] [place] [address]   Has the prescription been filled recently? No  Is the patient out of the medication? Yes  Has the patient been seen for an appointment in the last year OR does the patient have an upcoming appointment? Yes  Can we respond through MyChart? Yes  Agent: Please be advised that Rx refills may take up to 3 business days. We ask that you follow-up with your pharmacy.

## 2023-03-25 NOTE — Telephone Encounter (Signed)
 Last Fill: 05/22/22  Last OV: 05/22/22 Next OV: None Scheduled  Routing to provider for review/authorization.

## 2023-04-29 ENCOUNTER — Other Ambulatory Visit: Payer: Self-pay | Admitting: Internal Medicine

## 2023-05-29 ENCOUNTER — Other Ambulatory Visit: Payer: Self-pay | Admitting: Family Medicine

## 2023-05-29 ENCOUNTER — Ambulatory Visit: Admitting: Family Medicine

## 2023-06-03 ENCOUNTER — Ambulatory Visit (INDEPENDENT_AMBULATORY_CARE_PROVIDER_SITE_OTHER): Payer: Self-pay | Admitting: Family Medicine

## 2023-06-03 VITALS — BP 120/70 | HR 79 | Temp 98.1°F | Ht 61.0 in | Wt 206.0 lb

## 2023-06-03 DIAGNOSIS — E6609 Other obesity due to excess calories: Secondary | ICD-10-CM | POA: Diagnosis not present

## 2023-06-03 DIAGNOSIS — E559 Vitamin D deficiency, unspecified: Secondary | ICD-10-CM | POA: Diagnosis not present

## 2023-06-03 DIAGNOSIS — E66812 Obesity, class 2: Secondary | ICD-10-CM

## 2023-06-03 DIAGNOSIS — Z6838 Body mass index (BMI) 38.0-38.9, adult: Secondary | ICD-10-CM

## 2023-06-03 DIAGNOSIS — R7309 Other abnormal glucose: Secondary | ICD-10-CM | POA: Diagnosis not present

## 2023-06-03 MED ORDER — ZEPBOUND 2.5 MG/0.5ML ~~LOC~~ SOAJ
2.5000 mg | SUBCUTANEOUS | 1 refills | Status: DC
Start: 2023-06-03 — End: 2023-07-30

## 2023-06-03 NOTE — Progress Notes (Signed)
 I,Jameka J Llittleton, CMA,acting as a Neurosurgeon for [employer], NP.,have documented all relevant documentation on the behalf of Melodie Spry, NP,as directed by  Melodie Spry, NP while in the presence of Melodie Spry, NP.  Subjective:  Patient ID: Amy Lambert , female    DOB: 20-Mar-1995 , 28 y.o.   MRN: [id number]  Chief Complaint  Patient presents with   Weight Check    HPI  Patient is a 28 year old female who presents today for weight management. Patient has a diagnosis of prediabetes, vitamin D  deficiency and she would like to get labs today and  restart zepbound  once weekly injection because she used to take it but has missed some doses in the past two  months. Patient reports she is getting married soon and would like to lose 20lbs.     Past Medical History:  Diagnosis Date   Allergy    Anemia 12/13/2014   Anxiety    Colitis    Dysmenorrhea    GERD (gastroesophageal reflux disease)    History of kidney stones    IBS (irritable bowel syndrome)    constipation   Kidney infection    2X from UTI    Menorrhagia    Pre-diabetes    UTI (lower urinary tract infection)    Vitamin D  deficiency      Family History  Problem Relation Age of Onset   Migraines Mother    Diabetes Father    Diabetes Maternal Grandmother    Hypertension Maternal Grandmother    Heart attack Maternal Grandmother    Hypertension Paternal Grandmother    Hypertension Paternal Grandfather      Current Outpatient Medications:    levonorgestrel (KYLEENA) 19.5 MG IUD, , Disp: , Rfl:    naproxen (NAPROSYN) 250 MG tablet, Take by mouth 2 (two) times daily with a meal. (Patient not taking: Reported on 06/03/2023), Disp: , Rfl:    tirzepatide  (ZEPBOUND ) 2.5 MG/0.5ML Pen, Inject 2.5 mg into the skin once a week., Disp: 2 mL, Rfl: 1   Allergies  Allergen Reactions   Penicillins Hives    & fever & fever     Review of Systems  Constitutional: Negative.   HENT: Negative.    Respiratory: Negative.     Cardiovascular: Negative.   Musculoskeletal: Negative.   Neurological: Negative.   Hematological: Negative.   Psychiatric/Behavioral: Negative.       Today's Vitals   06/03/23 1036  BP: 120/70  Pulse: 79  Temp: 98.1 F (36.7 C)  TempSrc: Oral  Weight: 206 lb (93.4 kg)  Height: 5\' 1"  (1.549 m)  PainSc: 0-No pain   Body mass index is 38.92 kg/m.  Wt Readings from Last 3 Encounters:  06/03/23 206 lb (93.4 kg)  07/07/22 185 lb (83.9 kg)  05/22/22 204 lb (92.5 kg)    The ASCVD Risk score (Arnett DK, et al., 2019) failed to calculate for the following reasons:   The 2019 ASCVD risk score is only valid for ages 69 to 45  Objective:  Physical Exam HENT:     Head: Normocephalic.  Cardiovascular:     Rate and Rhythm: Normal rate and regular rhythm.  Pulmonary:     Effort: Pulmonary effort is normal.     Breath sounds: Normal breath sounds.  Skin:    General: Skin is warm and dry.  Neurological:     General: No focal deficit present.     Mental Status: She is alert and oriented to person, place, and time.  Psychiatric:        Mood and Affect: Mood normal.        Behavior: Behavior normal.         Assessment And Plan:  Class 2 obesity due to excess calories without serious comorbidity with body mass index (BMI) of 38.0 to 38.9 in adult Assessment & Plan: She is encouraged to strive for BMI less than 30 to decrease cardiac risk. Advised to aim for at least 150 minutes of exercise per week.   Orders: -     Zepbound ; Inject 2.5 mg into the skin once a week.  Dispense: 2 mL; Refill: 1 -     CMP14+EGFR -     CBC with Differential/Platelet  Vitamin D  deficiency -     VITAMIN D  25 Hydroxy (Vit-D Deficiency, Fractures)  Abnormal glucose -     Hemoglobin A1c    Return in about 8 weeks (around 07/29/2023) for weight management.  Patient was given opportunity to ask questions. Patient verbalized understanding of the plan and was able to repeat key elements of the plan.  All questions were answered to their satisfaction.    I, Melodie Spry, NP, have reviewed all documentation for this visit. The documentation on 06/04/2023 for the exam, diagnosis, procedures, and orders are all accurate and complete.     IF YOU HAVE BEEN REFERRED TO A SPECIALIST, IT MAY TAKE 1-2 WEEKS TO SCHEDULE/PROCESS THE REFERRAL. IF YOU HAVE NOT HEARD FROM US /SPECIALIST IN TWO WEEKS, PLEASE GIVE US  A CALL AT [phone].

## 2023-06-03 NOTE — Patient Instructions (Signed)

## 2023-06-03 NOTE — Assessment & Plan Note (Signed)
 She is encouraged to strive for BMI less than 30 to decrease cardiac risk. Advised to aim for at least 150 minutes of exercise per week.

## 2023-06-04 LAB — CBC WITH DIFFERENTIAL/PLATELET
Basophils Absolute: 0 10*3/uL (ref 0.0–0.2)
Basos: 0 %
EOS (ABSOLUTE): 0.4 10*3/uL (ref 0.0–0.4)
Eos: 5 %
Hematocrit: 36.4 % (ref 34.0–46.6)
Hemoglobin: 11.7 g/dL (ref 11.1–15.9)
Immature Grans (Abs): 0 10*3/uL (ref 0.0–0.1)
Immature Granulocytes: 0 %
Lymphocytes Absolute: 2.7 10*3/uL (ref 0.7–3.1)
Lymphs: 37 %
MCH: 27.9 pg (ref 26.6–33.0)
MCHC: 32.1 g/dL (ref 31.5–35.7)
MCV: 87 fL (ref 79–97)
Monocytes Absolute: 0.4 10*3/uL (ref 0.1–0.9)
Monocytes: 6 %
Neutrophils Absolute: 3.8 10*3/uL (ref 1.4–7.0)
Neutrophils: 52 %
Platelets: 358 10*3/uL (ref 150–450)
RBC: 4.2 x10E6/uL (ref 3.77–5.28)
RDW: 14.3 % (ref 11.7–15.4)
WBC: 7.3 10*3/uL (ref 3.4–10.8)

## 2023-06-04 LAB — HEMOGLOBIN A1C
Est. average glucose Bld gHb Est-mCnc: 117 mg/dL
Hgb A1c MFr Bld: 5.7 % — ABNORMAL HIGH (ref 4.8–5.6)

## 2023-06-04 LAB — CMP14+EGFR
ALT: 14 IU/L (ref 0–32)
AST: 11 IU/L (ref 0–40)
Albumin: 4.1 g/dL (ref 4.0–5.0)
Alkaline Phosphatase: 63 IU/L (ref 44–121)
BUN/Creatinine Ratio: 14 (ref 9–23)
BUN: 9 mg/dL (ref 6–20)
Bilirubin Total: 0.3 mg/dL (ref 0.0–1.2)
CO2: 23 mmol/L (ref 20–29)
Calcium: 9.3 mg/dL (ref 8.7–10.2)
Chloride: 104 mmol/L (ref 96–106)
Creatinine, Ser: 0.64 mg/dL (ref 0.57–1.00)
Globulin, Total: 3.3 g/dL (ref 1.5–4.5)
Glucose: 75 mg/dL (ref 70–99)
Potassium: 4.4 mmol/L (ref 3.5–5.2)
Sodium: 138 mmol/L (ref 134–144)
Total Protein: 7.4 g/dL (ref 6.0–8.5)
eGFR: 124 mL/min/{1.73_m2} (ref >59)

## 2023-06-04 LAB — VITAMIN D 25 HYDROXY (VIT D DEFICIENCY, FRACTURES): Vit D, 25-Hydroxy: 27.1 ng/mL — ABNORMAL LOW (ref 30.0–100.0)

## 2023-07-29 ENCOUNTER — Ambulatory Visit: Admitting: Internal Medicine

## 2023-07-30 ENCOUNTER — Encounter: Payer: Self-pay | Admitting: Internal Medicine

## 2023-07-30 ENCOUNTER — Telehealth (INDEPENDENT_AMBULATORY_CARE_PROVIDER_SITE_OTHER): Admitting: Internal Medicine

## 2023-07-30 VITALS — Ht 61.0 in | Wt 195.0 lb

## 2023-07-30 DIAGNOSIS — E66812 Obesity, class 2: Secondary | ICD-10-CM

## 2023-07-30 DIAGNOSIS — E88819 Insulin resistance, unspecified: Secondary | ICD-10-CM | POA: Diagnosis not present

## 2023-07-30 DIAGNOSIS — Z6836 Body mass index (BMI) 36.0-36.9, adult: Secondary | ICD-10-CM

## 2023-07-30 MED ORDER — ZEPBOUND 5 MG/0.5ML ~~LOC~~ SOAJ: 5.0000 mg | SUBCUTANEOUS | 1 refills | Status: DC

## 2023-07-30 NOTE — Progress Notes (Addendum)
 Virtual Visit via Video Note  I,[name], CMA,acting as a Neurosurgeon for [name], MD.,have documented all relevant documentation on the behalf of [name], MD,as directed by  [name], MD while in the presence of [name], MD.  I connected with [name] on 07/30/23 at  3:00 PM EDT by a video enabled telemedicine application and verified that I am speaking with the correct person using two identifiers.  Patient Location: Home Provider Location: Office/Clinic  I discussed the limitations, risks, security, and privacy concerns of performing an evaluation and management service by video and the availability of in person appointments. I also discussed with the patient that there may be a patient responsible charge related to this service. The patient expressed understanding and agreed to proceed.  Subjective: PCP: Slocumb Catheryn, MD  Chief Complaint  Patient presents with   Obesity    Patient presents today for a weight follow up, Patient reports compliance with medication. Patient denies any chest pain, SOB, or headaches. Patient has no concerns today. Patient would like to increase to 5mg .   Discussed the use of AI scribe software for clinical note transcription with the patient, who gave verbal consent to proceed.  History of Present Illness Amy Lambert is a 28 year old female who presents for a weight follow-up.  She has been taking Zepbound , administered on Monday nights, and experiences constipation as a side effect. She manages this by increasing her fiber intake and drinking at least 80 ounces of water daily.  Since starting the medication, she has lost 11 pounds, bringing her weight down to 195 pounds. She is considering an increase in her dosage from the current 2.5 mg to 5 mg, as she is currently on a low dose to acclimate to the medication.  She is actively engaging in physical activity, having recently started Pilates classes twice  a week, which she enjoys and plans to continue. She is also walking around her apartment complex and is looking for a trainer to begin weight training.  Her past medical history includes prediabetes, with a previous A1c of 5.7%.  She is engaged and planning her wedding for July 02, 2024. She uses an IUD for birth control.  ROS: Per HPI  Current Outpatient Medications:    levonorgestrel (KYLEENA) 19.5 MG IUD, , Disp: , Rfl:    tirzepatide  (ZEPBOUND ) 5 MG/0.5ML Pen, Inject 5 mg into the skin once a week., Disp: 2 mL, Rfl: 1   naproxen (NAPROSYN) 250 MG tablet, Take by mouth 2 (two) times daily with a meal. (Patient not taking: Reported on 07/30/2023), Disp: , Rfl:   Observations/Objective: Today's Vitals   07/30/23 1407  Weight: 195 lb (88.5 kg)  Height: 5' 1 (1.549 m)  PainSc: 0-No pain   BP Readings from Last 3 Encounters:  06/03/23 120/70  07/07/22 109/60  05/22/22 118/82   Wt Readings from Last 3 Encounters:  07/30/23 195 lb (88.5 kg)  06/03/23 206 lb (93.4 kg)  07/07/22 185 lb (83.9 kg)    Physical Exam Vitals and nursing note reviewed.  Constitutional:      Appearance: Normal appearance.  HENT:     Head: Normocephalic and atraumatic.  Eyes:     Extraocular Movements: Extraocular movements intact.  Pulmonary:     Effort: Pulmonary effort is normal.  Musculoskeletal:     Cervical back: Normal range of motion.  Neurological:     Mental Status: She is alert.  Assessment and Plan: Class 2 severe obesity due to excess calories with serious comorbidity and body mass index (BMI) of 36.0 to 36.9 in adult [facility]) Assessment & Plan: She lost 11 pounds on Zepbound  2.5 mg but experiences constipation. Plan to increase dose to 5 mg for further weight loss. Goal weight 165-170 pounds by year-end, then taper medication. - Send prescription for Zepbound  5 mg to [facility]. - Advise to increase fiber intake and maintain hydration. - Encourage continuation of physical  activity, including Pilates and walking. - Recommend strength training exercises. - Schedule follow-up appointment in 8-10 weeks.   Insulin  resistance Assessment & Plan: A1c is 5.7. Anticipate improvement with weight loss and Zepbound . She is motivated to reach goal weight. - Monitor A1c levels at next appointment. - Encourage continued weight loss and healthy lifestyle changes.   Other orders -     Zepbound ; Inject 5 mg into the skin once a week.  Dispense: 2 mL; Refill: 1  General Health Maintenance Needs immunization updates and Pap smear. Discussed importance of vaccinations and Pap smear. Plans annual exam with GYN. - Ask GYN about HPV vaccination during upcoming annual exam. - Check immunization registry for hepatitis B vaccination status. - Schedule Pap smear with GYN.  Follow Up Instructions: Return in 10 weeks (on 10/08/2023), or prefers Monday or Friday, for 2 months weight check.   I discussed the assessment and treatment plan with the patient. The patient was provided an opportunity to ask questions, and all were answered. The patient agreed with the plan and demonstrated an understanding of the instructions.   The patient was advised to call back or seek an in-person evaluation if the symptoms worsen or if the condition fails to improve as anticipated.  The above assessment and management plan was discussed with the patient. The patient verbalized understanding of and has agreed to the management plan.   I, [name], MD, have reviewed all documentation for this visit. The documentation on 07/30/23 for the exam, diagnosis, procedures, and orders are all accurate and complete.

## 2023-07-30 NOTE — Patient Instructions (Signed)

## 2023-08-03 DIAGNOSIS — E88819 Insulin resistance, unspecified: Secondary | ICD-10-CM | POA: Insufficient documentation

## 2023-08-03 NOTE — Assessment & Plan Note (Signed)
 A1c is 5.7. Anticipate improvement with weight loss and Zepbound . She is motivated to reach goal weight. - Monitor A1c levels at next appointment. - Encourage continued weight loss and healthy lifestyle changes.

## 2023-08-03 NOTE — Assessment & Plan Note (Signed)
 She lost 11 pounds on Zepbound  2.5 mg but experiences constipation. Plan to increase dose to 5 mg for further weight loss. Goal weight 165-170 pounds by year-end, then taper medication. - Send prescription for Zepbound  5 mg to [facility]. - Advise to increase fiber intake and maintain hydration. - Encourage continuation of physical activity, including Pilates and walking. - Recommend strength training exercises. - Schedule follow-up appointment in 8-10 weeks.

## 2023-08-11 ENCOUNTER — Ambulatory Visit: Payer: Self-pay

## 2023-08-11 ENCOUNTER — Encounter: Payer: Self-pay | Admitting: Internal Medicine

## 2023-08-11 NOTE — Telephone Encounter (Signed)
 FYI Only or Action Required?: Action required by provider: request for appointment.  Patient was last seen in primary care on 07/30/2023 by Jarold Medici, MD.  Called Nurse Triage reporting Eye Problem.  Symptoms began today.  Interventions attempted: Nothing.  Symptoms are: stable.Pt. Reports eye doctor told her she has a swollen optic nerve and needs follow up. No eye symptoms, only headaches.  Triage Disposition: See Physician Within 24 Hours  Patient/caregiver understands and will follow disposition?: Yes   Copied from CRM #[id number]. Topic: Clinical - Red Word Triage >> Aug 11, 2023  3:36 PM Kevelyn M wrote: Red Word that prompted transfer to Nurse Triage: Patient called in because she had her annual eye exam, and confirmed swelling in both eyes and left is the worst. Patient didn't want to go to the ER without speaking to Dr. Jarold first. Patient is really scared, and was crying. She doesn't feel bad. Reason for Disposition  Eye pain present > 24 hours  Answer Assessment - Initial Assessment Questions 1. ONSET: When did the pain start? (e.g., minutes, hours, days)     Headaches 2. TIMING: Does the pain come and go, or has it been constant since it started? (e.g., constant, intermittent, fleeting)     Comes and goes 3. SEVERITY: How bad is the pain?  (Scale 1-10; mild, moderate or severe)     Can be a 10 4. LOCATION: Where does it hurt?  (e.g., eyelid, eye, cheekbone)     both 5. CAUSE: What do you think is causing the pain?     Swollen optic nerve 6. VISION: Do you have blurred vision or changes in your vision?      no 7. EYE DISCHARGE: Is there any discharge (pus) from the eye(s)?  If Yes, ask: What color is it?      no 8. FEVER: Do you have a fever? If Yes, ask: What is it, how was it measured, and when did it start?      no 9. OTHER SYMPTOMS: Do you have any other symptoms? (e.g., headache, nasal discharge, facial rash)     headache 10.  PREGNANCY: Is there any chance you are pregnant? When was your last menstrual period?       no  Protocols used: Eye Pain and Other Symptoms-A-AH

## 2023-08-12 ENCOUNTER — Telehealth: Admitting: Internal Medicine

## 2023-08-12 ENCOUNTER — Encounter: Payer: Self-pay | Admitting: Internal Medicine

## 2023-08-12 DIAGNOSIS — E66812 Obesity, class 2: Secondary | ICD-10-CM | POA: Diagnosis not present

## 2023-08-12 DIAGNOSIS — H4710 Unspecified papilledema: Secondary | ICD-10-CM | POA: Diagnosis not present

## 2023-08-12 DIAGNOSIS — Z6836 Body mass index (BMI) 36.0-36.9, adult: Secondary | ICD-10-CM | POA: Diagnosis not present

## 2023-08-12 NOTE — Assessment & Plan Note (Signed)
 Potential contributing factor to pseudotumor cerebri. Acknowledged based on height and weight. Emphasized importance of weight management. - Continue with Zepbound 

## 2023-08-12 NOTE — Progress Notes (Signed)
 Virtual Visit via Video Note  I,Amy Lambert, CMA,acting as a Neurosurgeon for Lambert LOISE Slocumb, MD.,have documented all relevant documentation on the behalf of Lambert LOISE Slocumb, MD,as directed by  Lambert LOISE Slocumb, MD while in the presence of Lambert LOISE Slocumb, MD.  I connected with Amy Lambert on 08/12/23 at  9:00 AM EDT by a video enabled telemedicine application and verified that I am speaking with the correct person using two identifiers.  Patient Location: Home Provider Location: Office/Clinic  I discussed the limitations, risks, security, and privacy concerns of performing an evaluation and management service by video and the availability of in person appointments. I also discussed with the patient that there may be a patient responsible charge related to this service. The patient expressed understanding and agreed to proceed.  Subjective: PCP: Amy Catheryn, MD  Chief Complaint  Patient presents with   Swollen Optic Nerve    Patient presents today for swollen optic nerve. She went to her annual eye exam. After completing exam she received a letter stating she had swelling. She was told by ophthalmologist to go to local ER for further evaluation to make sure this is nothing more serious than what he has examined her for. She wants to discuss next steps, what she should do. How serious this could be. Patient sent letter via mychart.    Discussed the use of AI scribe software for clinical note transcription with the patient, who gave verbal consent to proceed.  History of Present Illness Amy Lambert is a 28 year old female who presents with concerns about pseudotumor cerebri.  She has been experiencing headaches since college, which she describes as manageable with over-the-counter medications like Tylenol  and Excedrin Migraine. No visual issues have been experienced, and her visual field testing was reported as normal.  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 He wanted her to go to ER; however, she did not want to do that. She preferred to discuss situation w/ pcp.   She resides in [place] and prefers to see a neurologist there. She is concerned about her upcoming trip to [place] and whether it should be canceled due to her condition.  She acknowledges that her condition may be linked to obesity and is aware of her weight status.    ROS: Per HPI  Current Outpatient Medications:    levonorgestrel (KYLEENA) 19.5 MG IUD, , Disp: , Rfl:    tirzepatide  (ZEPBOUND ) 5 MG/0.5ML Pen, Inject 5 mg into the skin once a week., Disp: 2 mL, Rfl: 1   naproxen (NAPROSYN) 250 MG tablet, Take by mouth 2 (two) times daily with a meal. (Patient not taking: Reported on 08/12/2023), Disp: , Rfl:   Observations/Objective: There were no vitals filed for this visit. Physical Exam Vitals and nursing note reviewed.  Constitutional:      Appearance: Normal appearance. She is obese.  HENT:     Head: Normocephalic and atraumatic.  Eyes:     Extraocular Movements: Extraocular movements intact.  Pulmonary:     Effort: Pulmonary effort is normal.  Musculoskeletal:     Cervical back: Normal range of motion.  Neurological:     Mental Status: She is alert.     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 No visual disturbances. Headaches not manageable with OTC medication. Linked to obesity and possible excessive vitamin A intake. Differential includes other intracranial pathologies. - Order  MRI of the brain to rule out other intracranial pathologies. - Refer to a neurologist in [place] or [place] for further evaluation and management. - Instruct her to check blood pressure at [facility] or similar location and report back. - Advise consultation with ophthalmologist regarding the safety of flying.  Orders: -     MR BRAIN W WO CONTRAST; Future -     Ambulatory referral to  Neurology  Class 2 severe obesity due to excess calories with serious comorbidity and body mass index (BMI) of 36.0 to 36.9 in adult [facility]) Assessment & Plan: Potential contributing factor to pseudotumor cerebri. Acknowledged based on height and weight. Emphasized importance of weight management. - Continue with Zepbound     Follow Up Instructions: Return if symptoms worsen or fail to improve.   I discussed the assessment and treatment plan with the patient. The patient was provided an opportunity to ask questions, and all were answered. The patient agreed with the plan and demonstrated an understanding of the instructions.   The patient was advised to call back or seek an in-person evaluation if the symptoms worsen or if the condition fails to improve as anticipated.  The above assessment and management plan was discussed with the patient. The patient verbalized understanding of and has agreed to the management plan.   I, Lambert LOISE Slocumb, MD, have reviewed all documentation for this visit. The documentation on 08/12/23 for the exam, diagnosis, procedures, and orders are all accurate and complete.

## 2023-08-12 NOTE — Assessment & Plan Note (Signed)
 Suspected due to optic nerve swelling. No visual disturbances. Headaches not manageable with OTC medication. Linked to obesity and possible excessive vitamin A intake. Differential includes other intracranial pathologies. - Order MRI of the brain to rule out other intracranial pathologies. - Refer to a neurologist in [place] or [place] for further evaluation and management. - Instruct her to check blood pressure at [facility] or similar location and report back. - Advise consultation with ophthalmologist regarding the safety of flying.

## 2023-08-12 NOTE — Patient Instructions (Signed)
Idiopathic Intracranial Hypertension  Idiopathic intracranial hypertension (IIH) is a condition that increases pressure around the brain. The fluid that surrounds the brain and spinal cord (cerebrospinal fluid, or CSF) increases and causes the pressure. Idiopathic means that the cause of this condition is not known. IIH affects the brain and spinal cord. If this condition is not treated, it can cause vision loss or blindness. What are the causes? The cause of this condition is not known. What increases the risk? The following factors may make you more likely to develop this condition: Being obese. Being a person who is female, between the ages of 76 and 28 years old, and who has not gone through menopause. Taking certain medicines, such as birth control, acne medicines, or steroids. What are the signs or symptoms? Symptoms of this condition include: Headaches. This is the most common symptom. Brief periods of total blindness. Double vision, blurred vision, or poor side (peripheral) vision. Pain in the shoulders or neck. Nausea and vomiting. A sound like rushing water or a pulsing sound within the ears (pulsatile tinnitus), or ringing in the ears. How is this diagnosed? This condition may be diagnosed based on: Your symptoms and medical history. Imaging tests of the brain, such as: CT scan. MRI. Magnetic resonance venogram (MRV) to check the veins. Diagnostic lumbar puncture. This is a procedure to remove and examine a sample of CSF. This procedure can determine whether your fluid pressure is too high. An eye exam to check for swelling or nerve damage in the eyes. How is this treated? Treatment for this condition depends on the symptoms. The goal of treatment is to decrease the pressure around your brain. Common treatments include: Weight loss through healthy eating, salt restriction, and exercise, if you are overweight. Medicines to decrease the production of CSF and lower the pressure  within your skull. Medicines to prevent or treat headaches. Other treatments may include: Surgery to place drains (shunts) in your brain to remove extra fluid. Lumbar puncture to remove extra CSF. Follow these instructions at home: If you are overweight or obese, work with your health care provider to lose weight. Take over-the-counter and prescription medicines only as told by your health care provider. Ask your health care provider if the medicine prescribed to you requires you to avoid driving or using machinery. Do not use any products that contain nicotine or tobacco. These products include cigarettes, chewing tobacco, and vaping devices, such as e-cigarettes. If you need help quitting, ask your health care provider. Keep all follow-up visits. Your health care provider will need to monitor you regularly. Contact a health care provider if: You have changes in your vision, such as: Double vision. Blurred vision. Poor peripheral vision. Get help right away if: You have any of the following symptoms and they get worse or do not get better: Headaches. Nausea. Vomiting. Sudden trouble seeing. This information is not intended to replace advice given to you by your health care provider. Make sure you discuss any questions you have with your health care provider. Document Revised: 06/04/2021 Document Reviewed: 05/14/2021 Elsevier Patient Education  2024 [employer].

## 2023-08-13 ENCOUNTER — Ambulatory Visit: Payer: Self-pay | Admitting: Family Medicine

## 2023-09-29 ENCOUNTER — Ambulatory Visit: Payer: Self-pay | Admitting: Internal Medicine

## 2023-09-29 ENCOUNTER — Encounter: Payer: Self-pay | Admitting: Internal Medicine

## 2023-09-29 VITALS — BP 122/74 | HR 84 | Temp 98.1°F | Ht 61.0 in | Wt 189.8 lb

## 2023-09-29 DIAGNOSIS — E88819 Insulin resistance, unspecified: Secondary | ICD-10-CM

## 2023-09-29 DIAGNOSIS — E66812 Obesity, class 2: Secondary | ICD-10-CM | POA: Diagnosis not present

## 2023-09-29 DIAGNOSIS — H4710 Unspecified papilledema: Secondary | ICD-10-CM

## 2023-09-29 DIAGNOSIS — Z6835 Body mass index (BMI) 35.0-35.9, adult: Secondary | ICD-10-CM

## 2023-09-29 NOTE — Progress Notes (Signed)
 I,Amy Lambert, CMA,acting as a Neurosurgeon for Amy LOISE Slocumb, MD.,have documented all relevant documentation on the behalf of Amy LOISE Slocumb, MD,as directed by  Amy LOISE Slocumb, MD while in the presence of Amy LOISE Slocumb, MD.  Subjective:  Patient ID: Amy Lambert , female    DOB: [birth year] , 28 y.o.   MRN: [id number]  Chief Complaint  Patient presents with  . Weight Check    Patient presents today for Zepbound  follow up. She reports compliance with medications. She does see good progress. She wants to know what are the long term expectations.     HPI  Patient is a 28 year old female who presents today for weight management. Patient has a diagnosis of prediabetes, vitamin D  deficiency and she would like to get labs today and  restart zepbound  once weekly injection because she used to take it but has missed some doses in the past two  months. Patient reports she is getting married soon and would like to lose 20lbs.     Past Medical History:  Diagnosis Date  . Allergy   . Anemia 12/13/2014  . Anxiety   . Colitis   . Dysmenorrhea   . GERD (gastroesophageal reflux disease)   . History of kidney stones   . IBS (irritable bowel syndrome)    constipation  . Kidney infection    2X from UTI   . Menorrhagia   . Pre-diabetes   . UTI (lower urinary tract infection)   . Vitamin D  deficiency      Family History  Problem Relation Age of Onset  . Migraines Mother   . Diabetes Father   . Diabetes Maternal Grandmother   . Hypertension Maternal Grandmother   . Heart attack Maternal Grandmother   . Hypertension Paternal Grandmother   . Hypertension Paternal Grandfather      Current Outpatient Medications:  .  levonorgestrel (KYLEENA) 19.5 MG IUD, , Disp: , Rfl:  .  tirzepatide  (ZEPBOUND ) 5 MG/0.5ML Pen, Inject 5 mg into the skin once a week., Disp: 2 mL, Rfl: 1 .  acetaZOLAMIDE (DIAMOX) 250 MG tablet, Take 250 mg by mouth 2 (two) times daily., Disp: , Rfl:  .  naproxen  (NAPROSYN) 250 MG tablet, Take by mouth 2 (two) times daily with a meal. (Patient not taking: Reported on 09/29/2023), Disp: , Rfl:    Allergies  Allergen Reactions  . Penicillins Hives    & fever & fever     Review of Systems  Constitutional: Negative.   Respiratory: Negative.    Cardiovascular: Negative.   Neurological: Negative.   Psychiatric/Behavioral: Negative.       Today's Vitals   09/29/23 1619  BP: 122/74  Pulse: 84  Temp: 98.1 F (36.7 C)  SpO2: 98%  Weight: 189 lb 12.8 oz (86.1 kg)  Height: 5' 1 (1.549 m)   Body mass index is 35.86 kg/m.  Wt Readings from Last 3 Encounters:  09/29/23 189 lb 12.8 oz (86.1 kg)  07/30/23 195 lb (88.5 kg)  06/03/23 206 lb (93.4 kg)     Objective:  Physical Exam Vitals and nursing note reviewed.  Constitutional:      Appearance: Normal appearance. She is obese.  HENT:     Head: Normocephalic and atraumatic.  Eyes:     Extraocular Movements: Extraocular movements intact.  Cardiovascular:     Rate and Rhythm: Normal rate and regular rhythm.     Heart sounds: Normal heart sounds.  Pulmonary:  Effort: Pulmonary effort is normal.     Breath sounds: Normal breath sounds.  Musculoskeletal:     Cervical back: Normal range of motion.  Skin:    General: Skin is warm.  Neurological:     General: No focal deficit present.     Mental Status: She is alert.  Psychiatric:        Mood and Affect: Mood normal.        Behavior: Behavior normal.         Assessment And Plan:  Class 2 severe obesity due to excess calories with serious comorbidity and body mass index (BMI) of 35.0 to 35.9 in adult [facility])   Assessment & Plan Obesity, class 2 She lost 19 pounds since May, with 8 pounds since July, on Zepbound . Currently on the first effective dose. Goal is 160-165 pounds by June 2026. Feeling better overall with strength training and Pilates twice a week. - Increase Zepbound  to 7.5 mg to expedite weight loss for her wedding. -  Optimize protein intake and continue strength training. - Monitor weight and adjust medication dosage as needed. - Encourage early wedding dress shopping to account for weight changes. - Schedule virtual follow-up visit and in-person visit.  Insulin  resistance Insulin  resistance expected to improve with weight loss and lifestyle changes. Reports increased energy levels. Diet includes protein shakes, yogurt, and balanced meals with protein and vegetables. Avoids high-carb foods and increased physical activity. - Order blood work to assess prediabetes and kidney function. - Encourage continued dietary management with focus on protein intake. - Advise having carbohydrates at lunch rather than dinner. - Suggest using egg whites to increase protein intake in meals. Return for 2 month weight check..  Patient was given opportunity to ask questions. Patient verbalized understanding of the plan and was able to repeat key elements of the plan. All questions were answered to their satisfaction.   I, Amy LOISE Slocumb, MD, have reviewed all documentation for this visit. The documentation on 09/29/23 for the exam, diagnosis, procedures, and orders are all accurate and complete.   IF YOU HAVE BEEN REFERRED TO A SPECIALIST, IT MAY TAKE 1-2 WEEKS TO SCHEDULE/PROCESS THE REFERRAL. IF YOU HAVE NOT HEARD FROM US /SPECIALIST IN TWO WEEKS, PLEASE GIVE US  A CALL AT [phone].   THE PATIENT IS ENCOURAGED TO PRACTICE SOCIAL DISTANCING DUE TO THE COVID-19 PANDEMIC.

## 2023-09-29 NOTE — Patient Instructions (Signed)

## 2023-10-02 ENCOUNTER — Ambulatory Visit: Payer: Self-pay | Admitting: Internal Medicine

## 2023-10-02 LAB — BMP8+EGFR
BUN/Creatinine Ratio: 11 (ref 9–23)
BUN: 8 mg/dL (ref 6–20)
CO2: 21 mmol/L (ref 20–29)
Calcium: 9.1 mg/dL (ref 8.7–10.2)
Chloride: 103 mmol/L (ref 96–106)
Creatinine, Ser: 0.72 mg/dL (ref 0.57–1.00)
Glucose: 76 mg/dL (ref 70–99)
Potassium: 4.1 mmol/L (ref 3.5–5.2)
Sodium: 140 mmol/L (ref 134–144)
eGFR: 117 mL/min/1.73 (ref >59)

## 2023-10-02 LAB — HEMOGLOBIN A1C
Est. average glucose Bld gHb Est-mCnc: 111 mg/dL
Hgb A1c MFr Bld: 5.5 % (ref 4.8–5.6)

## 2023-10-04 NOTE — Assessment & Plan Note (Signed)
 She lost 19 pounds since May, with 8 pounds since July, on Zepbound . Currently on the first effective dose. Goal is 160-165 pounds by June 2026. Feeling better overall with strength training and Pilates twice a week. - Increase Zepbound  to 7.5 mg to expedite weight loss for her wedding. - Optimize protein intake and continue strength training. - Monitor weight and adjust medication dosage as needed. - Encourage early wedding dress shopping to account for weight changes. - Schedule virtual follow-up visit and in-person visit.

## 2023-10-04 NOTE — Assessment & Plan Note (Signed)
 Insulin  resistance expected to improve with weight loss and lifestyle changes. Reports increased energy levels. Diet includes protein shakes, yogurt, and balanced meals with protein and vegetables. Avoids high-carb foods and increased physical activity. - Order blood work to assess prediabetes and kidney function. - Encourage continued dietary management with focus on protein intake. - Advise having carbohydrates at lunch rather than dinner. - Suggest using egg whites to increase protein intake in meals.

## 2023-10-04 NOTE — Assessment & Plan Note (Addendum)
 Recently diagnosed by ophthalmology.  Most recent Neuro note reviewed.  Has upcoming MRI.   - Her headaches have improved.  - Start Diamox as per Neuro

## 2023-10-05 ENCOUNTER — Other Ambulatory Visit: Payer: Self-pay

## 2023-10-05 MED ORDER — TIRZEPATIDE-WEIGHT MANAGEMENT 7.5 MG/0.5ML ~~LOC~~ SOAJ
7.5000 mg | Freq: Once | SUBCUTANEOUS | Status: DC
Start: 2023-10-05 — End: 2023-12-12

## 2023-10-06 ENCOUNTER — Other Ambulatory Visit: Payer: Self-pay

## 2023-10-06 MED ORDER — ZEPBOUND 7.5 MG/0.5ML ~~LOC~~ SOAJ
7.5000 mg | SUBCUTANEOUS | 2 refills | Status: DC
Start: 2023-10-06 — End: 2023-12-12

## 2023-10-09 ENCOUNTER — Encounter: Payer: Self-pay | Admitting: Internal Medicine

## 2023-12-08 ENCOUNTER — Telehealth (INDEPENDENT_AMBULATORY_CARE_PROVIDER_SITE_OTHER): Payer: Self-pay | Admitting: Internal Medicine

## 2023-12-08 ENCOUNTER — Encounter: Payer: Self-pay | Admitting: Internal Medicine

## 2023-12-08 VITALS — Ht 61.0 in | Wt 177.0 lb

## 2023-12-08 DIAGNOSIS — E66811 Obesity, class 1: Secondary | ICD-10-CM | POA: Diagnosis not present

## 2023-12-08 DIAGNOSIS — Z713 Dietary counseling and surveillance: Secondary | ICD-10-CM

## 2023-12-08 DIAGNOSIS — N200 Calculus of kidney: Secondary | ICD-10-CM | POA: Diagnosis not present

## 2023-12-08 DIAGNOSIS — Z6833 Body mass index (BMI) 33.0-33.9, adult: Secondary | ICD-10-CM

## 2023-12-08 DIAGNOSIS — E6609 Other obesity due to excess calories: Secondary | ICD-10-CM

## 2023-12-08 DIAGNOSIS — E66812 Obesity, class 2: Secondary | ICD-10-CM

## 2023-12-08 NOTE — Patient Instructions (Signed)

## 2023-12-08 NOTE — Progress Notes (Addendum)
 Virtual Visit via Video Note  I,Jameka J Llittleton, CMA,acting as a scribe for [name], MD.,have documented all relevant documentation on the behalf of [name], MD,as directed by  [name], MD while in the presence of [name], MD.  I connected with Amy Lambert on 12/08/2023 at  4:00 PM EST by a video enabled telemedicine application and verified that I am speaking with the correct person using two identifiers.  Patient Location: Work office, private area Provider Location: Office/Clinic  I discussed the limitations, risks, security, and privacy concerns of performing an evaluation and management service by video and the availability of in person appointments. I also discussed with the patient that there may be a patient responsible charge related to this service. The patient expressed understanding and agreed to proceed.  Subjective: PCP: Slocumb Catheryn, MD  Chief Complaint  Patient presents with   Weight Check    Patient presents today for a weight check. Patient reports compliance with meds. Patient is tolerating injection well and doesn't have any concerns.   Discussed the use of AI scribe software for clinical note transcription with the patient, who gave verbal consent to proceed.  History of Present Illness Amy Lambert is a 28 year old female who presents for a follow-up regarding her weight management.  She is focused on weight management, currently weighing 177 pounds, having lost 12 pounds since September and 18 pounds since July. She is taking Zepbound  7.5 mg and has increased her Pilates sessions. She experiences decreased appetite, stating 'I am not hungry,' which makes it challenging to maintain her protein intake.  She recently experienced an episode of kidney stones, passing one stone last Tuesday and another on Friday. She has a history of similar episodes. She has started adding lemon to her water to help prevent future  stones.  Her current diet includes protein yogurt, beef sticks, and scrambled eggs with cottage cheese. She uses collagen and colostrum supplements. She drinks coffee and takes prenatal vitamins as a multivitamin.  No recent vaginal itching but reports discomfort and pressure during urination, similar to a urinary tract infection. She does not drink sweet tea and has started drinking lemon water.     ROS: Per HPI  Current Outpatient Medications:    levonorgestrel (KYLEENA) 19.5 MG IUD, , Disp: , Rfl:    naproxen (NAPROSYN) 250 MG tablet, Take by mouth 2 (two) times daily with a meal., Disp: , Rfl:    tirzepatide  (ZEPBOUND ) 7.5 MG/0.5ML Pen, Inject 7.5 mg into the skin once a week., Disp: 2 mL, Rfl: 2  Observations/Objective: Today's Vitals   12/08/23 1451  Weight: 177 lb (80.3 kg)  Height: 5' 1 (1.549 m)   BP Readings from Last 3 Encounters:  09/29/23 122/74  06/03/23 120/70  07/07/22 109/60   Wt Readings from Last 3 Encounters:  12/08/23 177 lb (80.3 kg)  09/29/23 189 lb 12.8 oz (86.1 kg)  07/30/23 195 lb (88.5 kg)    Physical Exam Vitals and nursing note reviewed.  Constitutional:      Appearance: Normal appearance.  HENT:     Head: Normocephalic and atraumatic.  Eyes:     Extraocular Movements: Extraocular movements intact.  Pulmonary:     Effort: Pulmonary effort is normal.  Musculoskeletal:     Cervical back: Normal range of motion.  Neurological:     Mental Status: She is alert.     Assessment and Plan: Class 1 obesity due to excess calories with  body mass index (BMI) of 33.0 to 33.9 in adult, unspecified whether serious comorbidity present Assessment & Plan: Weight loss of 18 pounds since July with Zepbound  7.5 mg. Current weight 177 pounds, 17 pounds from goal. Decreased appetite noted. - Continue Zepbound  7.5 mg. - Increase protein intake to 25-30 grams per meal. - Incorporate strength training with a trainer. - Consume healthy fats such as avocados,  walnuts, and olive oil. - Add collagen powder to coffee. - Consider egg bakes with cottage cheese and vegetables. - Use bone broth in grains for extra protein. - Continue prenatal vitamins.   Nephrolithiasis Assessment & Plan: Recent ED notes reviewed. Recent passage of two kidney stones. High spinach intake may contribute to recurrence. Urine culture normal. Urology follow-up scheduled. - Reduce spinach intake to no more than once or twice a week. - Increase water and lemon intake. - Avoid electrolytes until resuming exercise. - Attend urology appointment on Thursday.   Class 2 severe obesity due to excess calories with serious comorbidity and body mass index (BMI) of 35.0 to 35.9 in adult -     Zepbound ; Inject 7.5 mg into the skin once a week.  Dispense: 2 mL; Refill: 2    Follow Up Instructions: Return for 2 month weight check.   I discussed the assessment and treatment plan with the patient. The patient was provided an opportunity to ask questions, and all were answered. The patient agreed with the plan and demonstrated an understanding of the instructions.   The patient was advised to call back or seek an in-person evaluation if the symptoms worsen or if the condition fails to improve as anticipated.  The above assessment and management plan was discussed with the patient. The patient verbalized understanding of and has agreed to the management plan.   I, [name], MD, have reviewed all documentation for this visit. The documentation on 12/08/23 for the exam, diagnosis, procedures, and orders are all accurate and complete.

## 2023-12-12 MED ORDER — ZEPBOUND 7.5 MG/0.5ML ~~LOC~~ SOAJ: 7.5000 mg | SUBCUTANEOUS | 2 refills | Status: DC

## 2023-12-12 NOTE — Assessment & Plan Note (Signed)
 Weight loss of 18 pounds since July with Zepbound  7.5 mg. Current weight 177 pounds, 17 pounds from goal. Decreased appetite noted. - Continue Zepbound  7.5 mg. - Increase protein intake to 25-30 grams per meal. - Incorporate strength training with a trainer. - Consume healthy fats such as avocados, walnuts, and olive oil. - Add collagen powder to coffee. - Consider egg bakes with cottage cheese and vegetables. - Use bone broth in grains for extra protein. - Continue prenatal vitamins.

## 2023-12-12 NOTE — Assessment & Plan Note (Signed)
 Recent ED notes reviewed. Recent passage of two kidney stones. High spinach intake may contribute to recurrence. Urine culture normal. Urology follow-up scheduled. - Reduce spinach intake to no more than once or twice a week. - Increase water and lemon intake. - Avoid electrolytes until resuming exercise. - Attend urology appointment on Thursday.

## 2024-02-16 ENCOUNTER — Encounter: Payer: Self-pay | Admitting: Internal Medicine

## 2024-02-16 ENCOUNTER — Ambulatory Visit: Payer: Self-pay | Admitting: Internal Medicine

## 2024-02-16 ENCOUNTER — Telehealth: Admitting: Internal Medicine

## 2024-02-16 VITALS — Wt 168.0 lb

## 2024-02-16 DIAGNOSIS — E66811 Obesity, class 1: Secondary | ICD-10-CM | POA: Diagnosis not present

## 2024-02-16 DIAGNOSIS — Z6833 Body mass index (BMI) 33.0-33.9, adult: Secondary | ICD-10-CM | POA: Diagnosis not present

## 2024-02-16 DIAGNOSIS — E6609 Other obesity due to excess calories: Secondary | ICD-10-CM | POA: Diagnosis not present

## 2024-02-16 MED ORDER — ZEPBOUND 7.5 MG/0.5ML ~~LOC~~ SOAJ: 7.5000 mg | SUBCUTANEOUS | 2 refills | Status: AC

## 2024-02-16 NOTE — Assessment & Plan Note (Signed)
 She was congratulated on losing 9 lbs since November 2025 and 21 lbs since Sept 2025.   - She will continue with Zepbound  7.5mg  weekly - Resume regular exercise, with strength training at least twice weekly - Follow up in person in six weeks.

## 2024-02-16 NOTE — Progress Notes (Signed)
 "  Virtual Visit via Video Note  I,Amy Amy, CMA,acting as a neurosurgeon for Amy LOISE Slocumb, MD.,have documented all relevant documentation on the behalf of Amy LOISE Slocumb, MD,as directed by  Amy LOISE Slocumb, MD while in the presence of Amy LOISE Slocumb, MD.  I connected with Amy Amy on 02/16/24 at  4:00 PM EST by a video enabled telemedicine application and verified that I am speaking with the correct person using two identifiers.  Patient Location: Home Provider Location: Office/Clinic  I discussed the limitations, risks, security, and privacy concerns of performing an evaluation and management service by video and the availability of in person appointments. I also discussed with the patient that there may be a patient responsible charge related to this service. The patient expressed understanding and agreed to proceed.  Subjective: PCP: Amy Catheryn, MD  Chief Complaint  Patient presents with   Weight Check     Patient presents today for a weight check. Patient reports compliance with meds. Patient is tolerating injection well and doesn't have any concerns.     She presents today for virtual visit for weight loss. She has been taking Zepbound  7.5mg  weekly without any issues.  She admits that she has not exercised over the holidays. She plans to resume Pilates and walking in the near future.   Her goal weight is 165 lbs. She is excited about her weight loss thus far. Additionally, she is getting married in June 2026 and has realized that she will need a smaller dress!   ROS: Per HPI  Current Outpatient Medications:    levonorgestrel (KYLEENA) 19.5 MG IUD, , Disp: , Rfl:    naproxen (NAPROSYN) 250 MG tablet, Take by mouth 2 (two) times daily with a meal., Disp: , Rfl:    tirzepatide  (ZEPBOUND ) 7.5 MG/0.5ML Pen, Inject 7.5 mg into the skin once a week., Disp: 2 mL, Rfl: 2  Observations/Objective: Today's Vitals   02/16/24 1447  Weight: 168 lb (76.2 kg)   Wt  Readings from Last 3 Encounters:  02/16/24 168 lb (76.2 kg)  12/08/23 177 lb (80.3 kg)  09/29/23 189 lb 12.8 oz (86.1 kg)     Physical Exam Vitals and nursing note reviewed.  Constitutional:      Appearance: Normal appearance. She is obese.  HENT:     Head: Normocephalic and atraumatic.  Eyes:     Extraocular Movements: Extraocular movements intact.  Pulmonary:     Effort: Pulmonary effort is normal.  Musculoskeletal:     Cervical back: Normal range of motion.  Neurological:     Mental Status: She is alert.     Assessment and Plan: Class 1 obesity due to excess calories with body mass index (BMI) of 33.0 to 33.9 in adult, unspecified whether serious comorbidity present Assessment & Plan: She was congratulated on losing 9 lbs since November 2025 and 21 lbs since Sept 2025.   - She will continue with Zepbound  7.5mg  weekly - Resume regular exercise, with strength training at least twice weekly - Follow up in person in six weeks.   Orders: -     Zepbound ; Inject 7.5 mg into the skin once a week.  Dispense: 2 mL; Refill: 2    Follow Up Instructions: Return for 2 month in person weight check.   I discussed the assessment and treatment plan with the patient. The patient was provided an opportunity to ask questions, and all were answered. The patient agreed with the plan and demonstrated an understanding of  the instructions.   The patient was advised to call back or seek an in-person evaluation if the symptoms worsen or if the condition fails to improve as anticipated.  The above assessment and management plan was discussed with the patient. The patient verbalized understanding of and has agreed to the management plan.   I, Amy LOISE Slocumb, MD, have reviewed all documentation for this visit. The documentation on 02/16/24 for the exam, diagnosis, procedures, and orders are all accurate and complete.   "

## 2024-02-16 NOTE — Patient Instructions (Signed)

## 2024-02-16 NOTE — Patient Instructions (Incomplete)

## 2024-02-16 NOTE — Progress Notes (Unsigned)
 I,Calianne Larue T Emmitt, CMA,acting as a neurosurgeon for [name], MD.,have documented all relevant documentation on the behalf of [name], MD,as directed by  [name], MD while in the presence of [name], MD.  Subjective:  Patient ID: Amy Lambert , female    DOB: [birth year] , 29 y.o.   MRN: [id number]  No chief complaint on file.   HPI  HPI   Past Medical History:  Diagnosis Date   Allergy    Anemia 12/13/2014   Anxiety    Colitis    Dysmenorrhea    GERD (gastroesophageal reflux disease)    History of kidney stones    IBS (irritable bowel syndrome)    constipation   Kidney infection    2X from UTI    Menorrhagia    Pre-diabetes    UTI (lower urinary tract infection)    Vitamin D  deficiency      Family History  Problem Relation Age of Onset   Migraines Mother    Diabetes Father    Diabetes Maternal Grandmother    Hypertension Maternal Grandmother    Heart attack Maternal Grandmother    Hypertension Paternal Grandmother    Hypertension Paternal Grandfather     Current Medications[1]   Allergies[2]   Review of Systems  Constitutional: Negative.   Respiratory: Negative.    Cardiovascular: Negative.   Neurological: Negative.   Psychiatric/Behavioral: Negative.       There were no vitals filed for this visit. There is no height or weight on file to calculate BMI.  Wt Readings from Last 3 Encounters:  12/08/23 177 lb (80.3 kg)  09/29/23 189 lb 12.8 oz (86.1 kg)  07/30/23 195 lb (88.5 kg)    The ASCVD Risk score (Arnett DK, et al., 2019) failed to calculate for the following reasons:   The 2019 ASCVD risk score is only valid for ages 34 to 89   * - Cholesterol units were assumed  Objective:  Physical Exam      Assessment And Plan:   Assessment & Plan Class 1 obesity due to excess calories with body mass index (BMI) of 33.0 to 33.9 in adult, unspecified whether serious comorbidity present   No orders of the defined types were  placed in this encounter.    No follow-ups on file.  Patient was given opportunity to ask questions. Patient verbalized understanding of the plan and was able to repeat key elements of the plan. All questions were answered to their satisfaction.    I, [name], MD, have reviewed all documentation for this visit. The documentation on 02/16/24 for the exam, diagnosis, procedures, and orders are all accurate and complete.   IF YOU HAVE BEEN REFERRED TO A SPECIALIST, IT MAY TAKE 1-2 WEEKS TO SCHEDULE/PROCESS THE REFERRAL. IF YOU HAVE NOT HEARD FROM US /SPECIALIST IN TWO WEEKS, PLEASE GIVE US  A CALL AT [phone].     [1]  Current Outpatient Medications:    levonorgestrel (KYLEENA) 19.5 MG IUD, , Disp: , Rfl:    naproxen (NAPROSYN) 250 MG tablet, Take by mouth 2 (two) times daily with a meal., Disp: , Rfl:    tirzepatide  (ZEPBOUND ) 7.5 MG/0.5ML Pen, Inject 7.5 mg into the skin once a week., Disp: 2 mL, Rfl: 2 [2]  Allergies Allergen Reactions   Penicillins Hives    & fever & fever

## 2024-02-17 ENCOUNTER — Ambulatory Visit: Admitting: Internal Medicine

## 2024-03-29 ENCOUNTER — Ambulatory Visit: Payer: Self-pay | Admitting: Internal Medicine
